# Patient Record
Sex: Female | Born: 1990 | Race: Black or African American | Hispanic: No | Marital: Single | Smoking: Former smoker
Health system: Southern US, Community
[De-identification: ages and names within clinical notes are randomized; demographics above are authoritative.]

## PROBLEM LIST (undated history)

## (undated) DIAGNOSIS — R55 Syncope and collapse: Secondary | ICD-10-CM

## (undated) DIAGNOSIS — F32A Depression, unspecified: Secondary | ICD-10-CM

## (undated) DIAGNOSIS — F329 Major depressive disorder, single episode, unspecified: Secondary | ICD-10-CM

## (undated) DIAGNOSIS — O1490 Unspecified pre-eclampsia, unspecified trimester: Secondary | ICD-10-CM

## (undated) DIAGNOSIS — F419 Anxiety disorder, unspecified: Secondary | ICD-10-CM

## (undated) DIAGNOSIS — J45909 Unspecified asthma, uncomplicated: Secondary | ICD-10-CM

## (undated) HISTORY — PX: ECTOPIC PREGNANCY SURGERY: SHX613

## (undated) HISTORY — DX: Unspecified pre-eclampsia, unspecified trimester: O14.90

---
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
## 2010-11-22 ENCOUNTER — Emergency Department: Payer: Self-pay | Admitting: Emergency Medicine

## 2011-03-01 LAB — HM PAP SMEAR: HM Pap smear: NEGATIVE

## 2011-03-27 ENCOUNTER — Emergency Department: Payer: Self-pay | Admitting: *Deleted

## 2011-08-29 ENCOUNTER — Emergency Department: Payer: Self-pay | Admitting: Emergency Medicine

## 2011-12-31 ENCOUNTER — Emergency Department: Payer: Self-pay | Admitting: Emergency Medicine

## 2011-12-31 LAB — CBC WITH DIFFERENTIAL/PLATELET
Basophil #: 0 10*3/uL (ref 0.0–0.1)
Eosinophil #: 0.1 10*3/uL (ref 0.0–0.7)
Eosinophil %: 0.8 %
HCT: 43.5 % (ref 35.0–47.0)
HGB: 14.2 g/dL (ref 12.0–16.0)
Lymphocyte #: 0.7 10*3/uL — ABNORMAL LOW (ref 1.0–3.6)
Lymphocyte %: 5.6 %
MCHC: 32.7 g/dL (ref 32.0–36.0)
MCV: 91 fL (ref 80–100)
Neutrophil %: 87.9 %
RDW: 13.5 % (ref 11.5–14.5)
WBC: 12.1 10*3/uL — ABNORMAL HIGH (ref 3.6–11.0)

## 2011-12-31 LAB — BASIC METABOLIC PANEL
Anion Gap: 10 (ref 7–16)
Calcium, Total: 8.9 mg/dL (ref 8.5–10.1)
Creatinine: 1.03 mg/dL (ref 0.60–1.30)
EGFR (African American): >60
EGFR (Non-African Amer.): >60
Osmolality: 277 (ref 275–301)

## 2012-05-14 ENCOUNTER — Emergency Department: Payer: Self-pay

## 2012-05-14 LAB — COMPREHENSIVE METABOLIC PANEL
Albumin: 4.1 g/dL (ref 3.4–5.0)
Anion Gap: 8 (ref 7–16)
BUN: 14 mg/dL (ref 7–18)
Bilirubin,Total: 0.4 mg/dL (ref 0.2–1.0)
Co2: 26 mmol/L (ref 21–32)
Creatinine: 1.22 mg/dL (ref 0.60–1.30)
EGFR (Non-African Amer.): >60
Glucose: 72 mg/dL (ref 65–99)
Osmolality: 284 (ref 275–301)
Potassium: 3.7 mmol/L (ref 3.5–5.1)
SGOT(AST): 24 U/L (ref 15–37)
Sodium: 143 mmol/L (ref 136–145)
Total Protein: 7.8 g/dL (ref 6.4–8.2)

## 2012-05-14 LAB — PREGNANCY, URINE: Pregnancy Test, Urine: NEGATIVE m[IU]/mL

## 2012-05-14 LAB — URINALYSIS, COMPLETE
Bilirubin,UR: NEGATIVE
Glucose,UR: NEGATIVE mg/dL (ref 0–75)
Leukocyte Esterase: NEGATIVE
Ph: 7 (ref 4.5–8.0)
RBC,UR: <1 /HPF (ref 0–5)
Specific Gravity: 1.014 (ref 1.003–1.030)
Squamous Epithelial: 1

## 2012-05-14 LAB — CBC
HCT: 39.8 % (ref 35.0–47.0)
HGB: 13.6 g/dL (ref 12.0–16.0)
MCH: 30.9 pg (ref 26.0–34.0)
MCHC: 34.1 g/dL (ref 32.0–36.0)

## 2012-05-14 LAB — LIPASE, BLOOD: Lipase: 238 U/L (ref 73–393)

## 2012-08-23 ENCOUNTER — Emergency Department: Payer: Self-pay | Admitting: Emergency Medicine

## 2012-08-23 LAB — BASIC METABOLIC PANEL
Anion Gap: 9 (ref 7–16)
BUN: 11 mg/dL (ref 7–18)
Calcium, Total: 9 mg/dL (ref 8.5–10.1)
Creatinine: 1.13 mg/dL (ref 0.60–1.30)
EGFR (African American): >60
Glucose: 74 mg/dL (ref 65–99)
Osmolality: 274 (ref 275–301)
Potassium: 3.6 mmol/L (ref 3.5–5.1)
Sodium: 138 mmol/L (ref 136–145)

## 2012-08-23 LAB — CBC WITH DIFFERENTIAL/PLATELET
Basophil %: 0.6 %
Eosinophil #: 0 10*3/uL (ref 0.0–0.7)
Eosinophil %: 0.4 %
HCT: 42.7 % (ref 35.0–47.0)
MCHC: 32.6 g/dL (ref 32.0–36.0)
MCV: 90 fL (ref 80–100)
Monocyte #: 0.8 x10 3/mm (ref 0.2–0.9)
Monocyte %: 13.6 %
Neutrophil %: 77.7 %

## 2012-08-23 LAB — RAPID INFLUENZA A&B ANTIGENS

## 2012-12-06 ENCOUNTER — Emergency Department: Payer: Self-pay | Admitting: Emergency Medicine

## 2012-12-07 LAB — BASIC METABOLIC PANEL
Anion Gap: 6 — ABNORMAL LOW (ref 7–16)
BUN: 18 mg/dL (ref 7–18)
Chloride: 109 mmol/L — ABNORMAL HIGH (ref 98–107)
Co2: 25 mmol/L (ref 21–32)
EGFR (Non-African Amer.): 53 — ABNORMAL LOW
Osmolality: 282 (ref 275–301)
Potassium: 3.8 mmol/L (ref 3.5–5.1)
Sodium: 140 mmol/L (ref 136–145)

## 2012-12-07 LAB — CBC
HCT: 40.6 % (ref 35.0–47.0)
HGB: 13.7 g/dL (ref 12.0–16.0)
Platelet: 228 10*3/uL (ref 150–440)
RBC: 4.48 10*6/uL (ref 3.80–5.20)
RDW: 12.6 % (ref 11.5–14.5)

## 2013-01-08 ENCOUNTER — Encounter: Payer: Self-pay | Admitting: Nurse Practitioner

## 2013-01-08 ENCOUNTER — Encounter: Payer: Self-pay | Admitting: Cardiothoracic Surgery

## 2013-01-11 ENCOUNTER — Encounter: Payer: Self-pay | Admitting: Cardiothoracic Surgery

## 2013-01-11 ENCOUNTER — Encounter: Payer: Self-pay | Admitting: Nurse Practitioner

## 2013-02-10 ENCOUNTER — Encounter: Payer: Self-pay | Admitting: Cardiothoracic Surgery

## 2013-02-10 ENCOUNTER — Encounter: Payer: Self-pay | Admitting: Nurse Practitioner

## 2013-02-10 LAB — WOUND CULTURE

## 2013-07-14 ENCOUNTER — Encounter (HOSPITAL_COMMUNITY): Payer: Self-pay | Admitting: Emergency Medicine

## 2013-07-14 ENCOUNTER — Emergency Department (HOSPITAL_COMMUNITY)
Admission: EM | Admit: 2013-07-14 | Discharge: 2013-07-14 | Disposition: A | Discharge status: Home / Self Care | Payer: Medicaid Other | Attending: Emergency Medicine | Admitting: Emergency Medicine

## 2013-07-14 ENCOUNTER — Emergency Department (HOSPITAL_COMMUNITY): Payer: Medicaid Other

## 2013-07-14 DIAGNOSIS — F329 Major depressive disorder, single episode, unspecified: Secondary | ICD-10-CM | POA: Insufficient documentation

## 2013-07-14 DIAGNOSIS — Z3202 Encounter for pregnancy test, result negative: Secondary | ICD-10-CM | POA: Insufficient documentation

## 2013-07-14 DIAGNOSIS — R112 Nausea with vomiting, unspecified: Secondary | ICD-10-CM | POA: Insufficient documentation

## 2013-07-14 DIAGNOSIS — Z79899 Other long term (current) drug therapy: Secondary | ICD-10-CM | POA: Insufficient documentation

## 2013-07-14 DIAGNOSIS — F3289 Other specified depressive episodes: Secondary | ICD-10-CM | POA: Insufficient documentation

## 2013-07-14 DIAGNOSIS — Z9889 Other specified postprocedural states: Secondary | ICD-10-CM | POA: Insufficient documentation

## 2013-07-14 DIAGNOSIS — R1011 Right upper quadrant pain: Secondary | ICD-10-CM | POA: Insufficient documentation

## 2013-07-14 DIAGNOSIS — F411 Generalized anxiety disorder: Secondary | ICD-10-CM | POA: Insufficient documentation

## 2013-07-14 HISTORY — DX: Depression, unspecified: F32.A

## 2013-07-14 HISTORY — DX: Major depressive disorder, single episode, unspecified: F32.9

## 2013-07-14 HISTORY — DX: Anxiety disorder, unspecified: F41.9

## 2013-07-14 LAB — CBC WITH DIFFERENTIAL/PLATELET
Eosinophils Absolute: 0.1 10*3/uL (ref 0.0–0.7)
Hemoglobin: 12.6 g/dL (ref 12.0–15.0)
Lymphocytes Relative: 25 % (ref 12–46)
Lymphs Abs: 1.5 10*3/uL (ref 0.7–4.0)
MCH: 30.7 pg (ref 26.0–34.0)
Monocytes Relative: 9 % (ref 3–12)
Neutro Abs: 3.8 10*3/uL (ref 1.7–7.7)
Neutrophils Relative %: 65 % (ref 43–77)
RBC: 4.11 MIL/uL (ref 3.87–5.11)
WBC: 5.8 10*3/uL (ref 4.0–10.5)

## 2013-07-14 LAB — URINALYSIS, ROUTINE W REFLEX MICROSCOPIC
Bilirubin Urine: NEGATIVE
Hgb urine dipstick: NEGATIVE
Ketones, ur: NEGATIVE mg/dL
Leukocytes, UA: NEGATIVE
Nitrite: NEGATIVE
Specific Gravity, Urine: 1.011 (ref 1.005–1.030)
Urobilinogen, UA: 0.2 mg/dL (ref 0.0–1.0)

## 2013-07-14 LAB — COMPREHENSIVE METABOLIC PANEL
Alkaline Phosphatase: 51 U/L (ref 39–117)
BUN: 9 mg/dL (ref 6–23)
CO2: 28 mEq/L (ref 19–32)
Chloride: 105 mEq/L (ref 96–112)
GFR calc Af Amer: >90 mL/min (ref >90)
GFR calc non Af Amer: 80 mL/min — ABNORMAL LOW (ref >90)
Glucose, Bld: 92 mg/dL (ref 70–99)
Potassium: 3.5 mEq/L (ref 3.5–5.1)
Total Bilirubin: 0.5 mg/dL (ref 0.3–1.2)
Total Protein: 6.6 g/dL (ref 6.0–8.3)

## 2013-07-14 LAB — LIPASE, BLOOD: Lipase: 20 U/L (ref 11–59)

## 2013-07-14 MED ORDER — MORPHINE SULFATE 4 MG/ML IJ SOLN
4.0000 mg | Freq: Once | INTRAMUSCULAR | Status: AC
  Administered 2013-07-14: 4 mg via INTRAVENOUS
  Filled 2013-07-14: qty 1

## 2013-07-14 MED ORDER — ONDANSETRON HCL 4 MG/2ML IJ SOLN
4.0000 mg | Freq: Once | INTRAMUSCULAR | Status: AC
  Administered 2013-07-14: 4 mg via INTRAVENOUS
  Filled 2013-07-14: qty 2

## 2013-07-14 MED ORDER — HYDROCODONE-ACETAMINOPHEN 5-325 MG PO TABS: 2.0000 | ORAL_TABLET | ORAL | Status: AC | PRN

## 2013-07-14 MED ORDER — ONDANSETRON 4 MG PO TBDP: 4.0000 mg | ORAL_TABLET | Freq: Three times a day (TID) | ORAL | Status: AC | PRN

## 2013-07-14 NOTE — ED Notes (Signed)
Pt reports right side abd pain that started yesterday, having nausea, no vomiting or diarrhea. Hx of same and was told she possibly has gallstones and have US done but pt has not followed up. Also reports recent yeast infection.

## 2013-07-14 NOTE — ED Provider Notes (Signed)
CSN: [id number]     Arrival date & time 07/14/13  1112 History   First MD Initiated Contact with Patient 07/14/13 1121     Chief Complaint  Patient presents with  . Abdominal Pain    HPI  Olivia Hanson is a 22 y.o. female with a PMH of anxiety and depression who presents to the ED for evaluation of abdominal pain.  History was provided by the patient.  Patient states that she's had abdominal pain starting this morning. Her pain is located in the right upper quadrant with some radiation to her right flank. Her pain is described as a sharp pain with intermittent episodes of fluctuation. She did not take anything for pain prior to arrival. She has had similar abdominal pain 2 years ago which was told she may have gallstones. She was told she needs to followup regarding this however she never didn't because the pain resolved. Movement makes her pain worse and nothing improves her pain. She states she's very nauseated and had a few episodes of retching however had no emesis. She denies any bowel symptoms including diarrhea or constipation. She denies any urinary symptoms including dysuria, hematuria, vaginal discharge, vaginal bleeding, genital sores or itching. She states that she just finished her menstrual period and takes Monistat at the end of her period. She denies any symptoms of a yeast infection, it is just "something she does."  Previous abdominal surgeries include a C-section.  She denies any fevers, chills, change in appetite or activity, and otherwise has been well.  No chest pain, SOB, headache, dizziness, lightheadedness, headaches, weakness, or leg edema.   No recent travel.     Past Medical History  Diagnosis Date  . Depression   . Anxiety    History reviewed. No pertinent past surgical history. History reviewed. No pertinent family history. History  Substance Use Topics  . Smoking status: Not on file  . Smokeless tobacco: Not on file  . Alcohol Use: No   OB History   Grav Para  Term Preterm Abortions TAB SAB Ect Mult Living                 Review of Systems  Constitutional: Negative for fever, chills, diaphoresis, activity change, appetite change and fatigue.  HENT: Negative for congestion, rhinorrhea and sore throat.   Eyes: Negative for visual disturbance.  Respiratory: Negative for cough, shortness of breath and wheezing.   Cardiovascular: Negative for chest pain and leg swelling.  Gastrointestinal: Positive for nausea, vomiting and abdominal pain. Negative for diarrhea, constipation, anal bleeding and rectal pain.  Genitourinary: Negative for dysuria, urgency, hematuria, decreased urine volume, vaginal bleeding, vaginal discharge and vaginal pain.  Musculoskeletal: Negative for back pain, myalgias and neck pain.  Skin: Negative for color change and wound.  Neurological: Negative for dizziness, syncope, weakness, light-headedness, numbness and headaches.  Psychiatric/Behavioral: Negative for confusion.    Allergies  Review of patient's allergies indicates no known allergies.  Home Medications   Current Outpatient Rx  Name  Route  Sig  Dispense  Refill  . citalopram (CELEXA) 20 MG tablet   Oral   Take 20 mg by mouth daily.         . clonazePAM (KLONOPIN) 0.5 MG tablet   Oral   Take 0.5 mg by mouth 2 (two) times daily.         . miconazole (MONISTAT 7) 2 % vaginal cream   Vaginal   Place 1 Applicatorful vaginally daily.  BP 140/70  Pulse 60  Temp(Src) 97.8 F (36.6 C) (Oral)  Resp 22  Wt 134 lb 6.4 oz (60.963 kg)  SpO2 100%  LMP 07/08/2013  Filed Vitals:   07/14/13 1432 07/14/13 1500 07/14/13 1535 07/14/13 1545  BP: 107/61 104/70 118/63 105/66  Pulse: 68 49 42   Temp:      TempSrc:      Resp:   16   Weight:      SpO2: 98% 100% 100% 100%     Physical Exam  Nursing note and vitals reviewed. Constitutional: She is oriented to person, place, and time. She appears well-developed and well-nourished. No distress.  HENT:   Head: Normocephalic and atraumatic.  Right Ear: External ear normal.  Left Ear: External ear normal.  Nose: Nose normal.  Mouth/Throat: Oropharynx is clear and moist. No oropharyngeal exudate.  Eyes: Conjunctivae are normal. Pupils are equal, round, and reactive to light. Right eye exhibits no discharge. Left eye exhibits no discharge.  Neck: Neck supple.  Cardiovascular: Normal rate, regular rhythm, normal heart sounds and intact distal pulses.  Exam reveals no gallop and no friction rub.   No murmur heard. Pulmonary/Chest: Effort normal and breath sounds normal. No respiratory distress. She has no wheezes. She has no rales. She exhibits no tenderness.  Abdominal: Soft. Bowel sounds are normal. She exhibits no distension and no mass. There is tenderness. There is no rebound and no guarding.  RUQ tenderness to palpation  Musculoskeletal: Normal range of motion. She exhibits no edema and no tenderness.  No flank/CVA/lumbar tenderness throughout  Neurological: She is alert and oriented to person, place, and time.  Skin: Skin is warm and dry. She is not diaphoretic.    ED Course  Procedures (including critical care time) Labs Review Labs Reviewed  CBC WITH DIFFERENTIAL  COMPREHENSIVE METABOLIC PANEL  LIPASE, BLOOD  URINALYSIS, ROUTINE W REFLEX MICROSCOPIC   Imaging Review No results found.  EKG Interpretation   None      Results for orders placed during the hospital encounter of 07/14/13  CBC WITH DIFFERENTIAL      Result Value Range   WBC 5.8  4.0 - 10.5 K/uL   RBC 4.11  3.87 - 5.11 MIL/uL   Hemoglobin 12.6  12.0 - 15.0 g/dL   HCT 16.1  09.6 - 04.5 %   MCV 87.6  78.0 - 100.0 fL   MCH 30.7  26.0 - 34.0 pg   MCHC 35.0  30.0 - 36.0 g/dL   RDW 40.9  81.1 - 91.4 %   Platelets 219  150 - 400 K/uL   Neutrophils Relative % 65  43 - 77 %   Neutro Abs 3.8  1.7 - 7.7 K/uL   Lymphocytes Relative 25  12 - 46 %   Lymphs Abs 1.5  0.7 - 4.0 K/uL   Monocytes Relative 9  3 - 12 %    Monocytes Absolute 0.5  0.1 - 1.0 K/uL   Eosinophils Relative 1  0 - 5 %   Eosinophils Absolute 0.1  0.0 - 0.7 K/uL   Basophils Relative 0  0 - 1 %   Basophils Absolute 0.0  0.0 - 0.1 K/uL  COMPREHENSIVE METABOLIC PANEL      Result Value Range   Sodium 142  135 - 145 mEq/L   Potassium 3.5  3.5 - 5.1 mEq/L   Chloride 105  96 - 112 mEq/L   CO2 28  19 - 32 mEq/L   Glucose, Bld 92  70 - 99 mg/dL   BUN 9  6 - 23 mg/dL   Creatinine, Ser 7.82  0.50 - 1.10 mg/dL   Calcium 8.9  8.4 - 95.6 mg/dL   Total Protein 6.6  6.0 - 8.3 g/dL   Albumin 3.9  3.5 - 5.2 g/dL   AST 15  0 - 37 U/L   ALT 13  0 - 35 U/L   Alkaline Phosphatase 51  39 - 117 U/L   Total Bilirubin 0.5  0.3 - 1.2 mg/dL   GFR calc non Af Amer 80 (*) >90 mL/min   GFR calc Af Amer >90  >90 mL/min  LIPASE, BLOOD      Result Value Range   Lipase 20  11 - 59 U/L  URINALYSIS, ROUTINE W REFLEX MICROSCOPIC      Result Value Range   Color, Urine YELLOW  YELLOW   APPearance CLEAR  CLEAR   Specific Gravity, Urine 1.011  1.005 - 1.030   pH 6.5  5.0 - 8.0   Glucose, UA NEGATIVE  NEGATIVE mg/dL   Hgb urine dipstick NEGATIVE  NEGATIVE   Bilirubin Urine NEGATIVE  NEGATIVE   Ketones, ur NEGATIVE  NEGATIVE mg/dL   Protein, ur NEGATIVE  NEGATIVE mg/dL   Urobilinogen, UA 0.2  0.0 - 1.0 mg/dL   Nitrite NEGATIVE  NEGATIVE   Leukocytes, UA NEGATIVE  NEGATIVE  POCT PREGNANCY, URINE      Result Value Range   Preg Test, Ur NEGATIVE  NEGATIVE        US Abdomen Complete (Final result)  Result time: 07/14/13 14:41:01    Final result by Rad Results In Interface (07/14/13 14:41:01)    Narrative:   CLINICAL DATA: Abdominal pain.  EXAM: ULTRASOUND ABDOMEN COMPLETE  COMPARISON: None.  FINDINGS: Gallbladder:  The gallbladder is adequately distended with no evidence of stones, wall thickening, or pericholecystic fluid. There is no positive sonographic Murphy's sign.  Common bile duct:  Diameter: 1.8 mm which is normal.  Liver:  No  focal lesion identified. Within normal limits in parenchymal echogenicity.  IVC:  No abnormality visualized.  Pancreas:  Visualized portion unremarkable.  Spleen:  Size and appearance within normal limits.  Right Kidney:  Length: 9.8 cm. Echogenicity within normal limits. No mass or hydronephrosis visualized.  Left Kidney:  Length: 10.3 cm. Echogenicity within normal limits. No mass or hydronephrosis visualized.  Abdominal aorta:  No aneurysm visualized.  Other findings:  None.  IMPRESSION: Normal abdominal ultrasound examination.   Electronically Signed By: David [place]azil[place] On: 07/14/2013 14:41         MDM   Olivia Hanson is a 22 y.o. female with a PMH of anxiety and depression who presents to the ED for evaluation of abdominal pain.  Morphine ordered for pain.  Zofran ordered for nausea.  CBC, CMP, lipase, UA, US abdomen ordered.    Rechecks  2:35 PM = Patient states she feels much better.  No abdominal pain.  Nausea improved and is returning.   3:30 PM = Repeat abdominal exam reveals mild RUQ tenderness however this has improved.  Spoke with patient about further testing and she does not want a CT.  She states she would like to go home and see if her pain returns/worsens.     Patient evaluated in emergency department for abdominal pain. Etiology of her right upper quadrant abdominal pain is unclear. Abdominal ultrasound was negative for an acute intra-abdominal process. Her labs were unremarkable. Patient had no complaints of lower abdominal  pain or pelvic pain. She had no GU symptoms as well. I did not feel a pelvic exam was necessary at this time. Patient had significant improvements in her pain with morphine. She had no episodes of emesis throughout her ED visit.  Patient felt well enough to be discharged home. She was given return precautions.  Appendicitis score is 1 for nausea.  She specifically was told to return to the emergency department if her  abdominal pain worsens or changes. Patient was given resources to followup with a primary care provider. She was given a prescription for Vicodin and Zofran for outpatient management. She was in agreement with discharge and plan.     Discharge Medication List as of 07/14/2013  3:37 PM    START taking these medications   Details  HYDROcodone-acetaminophen (NORCO/VICODIN) 5-325 MG per tablet Take 2 tablets by mouth every 4 (four) hours as needed., Starting 07/14/2013, Until Discontinued, Print    ondansetron (ZOFRAN ODT) 4 MG disintegrating tablet Take 1 tablet (4 mg total) by mouth every 8 (eight) hours as needed for nausea., Starting 07/14/2013, Until Discontinued, Print         Final impressions: 1. RUQ abdominal pain      Luiz Iron PA-C   This patient was discussed with Dr. Arita Miss, PA-C 07/14/13 1904

## 2013-07-15 NOTE — ED Provider Notes (Signed)
Medical screening examination/treatment/procedure(s) were conducted as a shared visit with non-physician practitioner(s) and myself.  I personally evaluated the patient during the encounter. Patient is a 22 year old female presents with complaints of right upper quadrant abdominal pain that started suddenly 2 hours prior to arrival. She denies any injury or trauma. She denies any vomiting or diarrhea. She denies experiencing prior episodes such as this.  On exam vitals are stable and she is afebrile. Heart is regular rate and rhythm and lungs are clear. There is tenderness to palpation in the right upper quadrant with no rebound and no guarding. There is no right lower quadrant tenderness to palpation and no CVA tenderness.  Workup reveals normal labs including LFTs and white count. Ultrasound of the right upper quadrant shows no gallstones and is otherwise unremarkable. At this point I feel as though she is stable for discharge. We did discuss the possibility of a CT scan, however the patient did not want to do this. She preferred to take the watch and wait approach and will return in the next 24 hours if not improving or if her symptoms worsen.       Geoffery Lyons, MD 07/15/13 [phone]

## 2013-08-10 ENCOUNTER — Emergency Department: Payer: Self-pay | Admitting: Emergency Medicine

## 2013-08-10 LAB — URINALYSIS, COMPLETE
Bacteria: NONE SEEN
Glucose,UR: NEGATIVE mg/dL (ref 0–75)
Nitrite: NEGATIVE
Ph: 6 (ref 4.5–8.0)
RBC,UR: 1 /HPF (ref 0–5)
Specific Gravity: 1.014 (ref 1.003–1.030)
Squamous Epithelial: 1
WBC UR: 1 /HPF (ref 0–5)

## 2013-08-10 LAB — CBC WITH DIFFERENTIAL/PLATELET
Basophil %: 0.9 %
Eosinophil #: 0 10*3/uL (ref 0.0–0.7)
HCT: 42.3 % (ref 35.0–47.0)
HGB: 14.2 g/dL (ref 12.0–16.0)
Lymphocyte #: 1.6 10*3/uL (ref 1.0–3.6)
Lymphocyte %: 26.3 %
MCHC: 33.5 g/dL (ref 32.0–36.0)
MCV: 89 fL (ref 80–100)
Monocyte #: 0.6 x10 3/mm (ref 0.2–0.9)
Monocyte %: 8.9 %
Platelet: 213 10*3/uL (ref 150–440)
RBC: 4.79 10*6/uL (ref 3.80–5.20)
RDW: 12.7 % (ref 11.5–14.5)

## 2013-08-10 LAB — HCG, QUANTITATIVE, PREGNANCY: Beta Hcg, Quant.: 688 m[IU]/mL — ABNORMAL HIGH

## 2013-08-26 LAB — BASIC METABOLIC PANEL WITH GFR
Anion Gap: 8
BUN: 11 mg/dL
Calcium, Total: 9.1 mg/dL
Chloride: 104 mmol/L
Co2: 25 mmol/L
Creatinine: 0.97 mg/dL
EGFR (African American): >60
EGFR (Non-African Amer.): >60
Glucose: 89 mg/dL
Osmolality: 273
Potassium: 3.2 mmol/L — ABNORMAL LOW
Sodium: 137 mmol/L

## 2013-08-26 LAB — CBC
HCT: 40.1 % (ref 35.0–47.0)
HGB: 13.7 g/dL (ref 12.0–16.0)
MCH: 30.3 pg (ref 26.0–34.0)
MCHC: 34.2 g/dL (ref 32.0–36.0)
MCV: 89 fL (ref 80–100)
Platelet: 285 10*3/uL (ref 150–440)
RBC: 4.52 10*6/uL (ref 3.80–5.20)
RDW: 13.1 % (ref 11.5–14.5)
WBC: 9.3 10*3/uL (ref 3.6–11.0)

## 2013-08-27 ENCOUNTER — Ambulatory Visit: Payer: Self-pay | Admitting: Obstetrics & Gynecology

## 2013-08-27 LAB — HCG, QUANTITATIVE, PREGNANCY: BETA HCG, QUANT.: 15022 m[IU]/mL — AB

## 2013-08-28 LAB — PATHOLOGY REPORT

## 2014-02-10 ENCOUNTER — Emergency Department: Payer: Self-pay | Admitting: Emergency Medicine

## 2014-03-09 LAB — HM HIV SCREENING LAB: HM HIV Screening: NEGATIVE

## 2014-03-12 ENCOUNTER — Emergency Department: Payer: Self-pay | Admitting: Emergency Medicine

## 2014-04-20 ENCOUNTER — Emergency Department: Payer: Self-pay | Admitting: Emergency Medicine

## 2014-04-20 LAB — COMPREHENSIVE METABOLIC PANEL
ALBUMIN: 4 g/dL (ref 3.4–5.0)
AST: 16 U/L (ref 15–37)
Alkaline Phosphatase: 63 U/L
Anion Gap: 4 — ABNORMAL LOW (ref 7–16)
BUN: 13 mg/dL (ref 7–18)
Bilirubin,Total: 0.4 mg/dL (ref 0.2–1.0)
CALCIUM: 9 mg/dL (ref 8.5–10.1)
CHLORIDE: 107 mmol/L (ref 98–107)
Co2: 27 mmol/L (ref 21–32)
Creatinine: 1.08 mg/dL (ref 0.60–1.30)
EGFR (African American): >60
EGFR (Non-African Amer.): >60
Glucose: 91 mg/dL (ref 65–99)
Osmolality: 275 (ref 275–301)
Potassium: 3.9 mmol/L (ref 3.5–5.1)
SGPT (ALT): 22 U/L
Sodium: 138 mmol/L (ref 136–145)
Total Protein: 7.6 g/dL (ref 6.4–8.2)

## 2014-04-20 LAB — CBC WITH DIFFERENTIAL/PLATELET
Basophil #: 0 10*3/uL (ref 0.0–0.1)
Basophil %: 0.5 %
EOS ABS: 0.2 10*3/uL (ref 0.0–0.7)
EOS PCT: 2.3 %
HCT: 41.5 % (ref 35.0–47.0)
HGB: 13.7 g/dL (ref 12.0–16.0)
LYMPHS PCT: 25.4 %
Lymphocyte #: 2 10*3/uL (ref 1.0–3.6)
MCH: 30.2 pg (ref 26.0–34.0)
MCHC: 33.1 g/dL (ref 32.0–36.0)
MCV: 91 fL (ref 80–100)
MONOS PCT: 6.5 %
Monocyte #: 0.5 x10 3/mm (ref 0.2–0.9)
NEUTROS ABS: 5.3 10*3/uL (ref 1.4–6.5)
NEUTROS PCT: 65.3 %
Platelet: 241 10*3/uL (ref 150–440)
RBC: 4.55 10*6/uL (ref 3.80–5.20)
RDW: 13.5 % (ref 11.5–14.5)
WBC: 8 10*3/uL (ref 3.6–11.0)

## 2014-04-20 LAB — URINALYSIS, COMPLETE
BACTERIA: NONE SEEN
Bilirubin,UR: NEGATIVE
Blood: NEGATIVE
Glucose,UR: NEGATIVE mg/dL (ref 0–75)
Ketone: NEGATIVE
Leukocyte Esterase: NEGATIVE
NITRITE: NEGATIVE
Ph: 6 (ref 4.5–8.0)
Protein: NEGATIVE
RBC, UR: NONE SEEN /HPF (ref 0–5)
SPECIFIC GRAVITY: 1.011 (ref 1.003–1.030)
Squamous Epithelial: 1
WBC UR: NONE SEEN /HPF (ref 0–5)

## 2014-04-20 LAB — LIPASE, BLOOD: Lipase: 217 U/L (ref 73–393)

## 2014-09-24 ENCOUNTER — Emergency Department: Payer: Self-pay | Admitting: Emergency Medicine

## 2014-12-04 NOTE — Op Note (Signed)
PATIENT NAME:  Olivia Hanson, Olivia Hanson MR#:  [id number] DATE OF BIRTH:  [birth year]  DATE OF PROCEDURE:  08/27/2013  PREOPERATIVE DIAGNOSIS: Right ectopic pregnancy.   POSTOPERATIVE DIAGNOSIS: Ruptured right ectopic pregnancy.   SURGEON: Dierdre Searles. Olivia Hanson, M.D.   ANESTHESIA: General.   ESTIMATED BLOOD LOSS: There was 250 mL of blood in the pelvis at the start of the case. There was minimal blood loss with any additional procedures during the case.   COMPLICATIONS: None.   FINDINGS: Significant adhesions in the pelvis. There was a swollen right fallopian tube with rupture of its ectopic pregnancy that was bleeding.   DISPOSITION: To recovery room in stable condition.   TECHNIQUE: The patient is prepped and draped in the usual sterile fashion. After adequate anesthesia is obtained in the dorsal lithotomy position, the bladder is drained with a Robinson catheter. Speculum was placed, and the cervix was identified and a Hulka tenaculum was placed.   A Veress needle is placed through a 5 mm infraumbilical incision after Marcaine is used to anesthetize the skin. Veress needle placement is confirmed using the hanging drop technique, and the abdomen is then insufflated with CO2 gas. A 5 mm trocar is then inserted under direct visualization with a laparoscope with no injuries or bleeding noted. The patient is placed in Trendelenburg position, and the above-mentioned adhesions and blood in the pelvis are noted. A left lower quadrant 5 mm incision is made with a trocar placed lateral to the inferior epigastric blood vessels with no injuries or bleeding noted. Using the 5 mm Harmonic scalpel, adhesions of the omentum to the anterior abdominal wall in front of the umbilical trocar are then lysed carefully with no bleeding noted for better visualization purposes. An 11 mm trocar is then placed in the suprapubic region for further instrumentation. Aspiration of blood is performed. The right fallopian tube is identified  and grasped and is carefully dissected free with preservation of the right ovary and its blood supply. Once the fallopian tube is dissected free, excellent hemostasis is noted at this site. It is placed in an Endopouch and removed and sent to pathology for further review. The pelvic area was irrigated with fluid, with aspiration of all fluid. Excellent hemostasis is noted of the operative site. Some adhesions are noted still where the cesarean section was. This was posterior to the uterus and around the left adnexa, although not significantly encasing the left adnexa. These are left in place at this time as there is no additional benefit to removing these adhesions. The bowel is normal. Appendix is normal. Liver is normal. Fluid is aspirated. The patient is leveled. Trocars are removed after gas is expelled from the abdomen. The skin is closed with Dermabond. Hulka tenaculum was also removed. The patient goes to recovery room in stable condition. All sponge, instrument and needle counts are correct.   ____________________________ R. Annamarie MajorPaul Dmani Mizer, MD rph:gb D: 08/27/2013 03:11:09 ET T: 08/27/2013 03:34:24 ET JOB#: [id number]  cc: Dierdre Searles. Olivia Taelyn Broecker, MD, <Dictator> Olivia MustardOBERT P [name] MD ELECTRONICALLY SIGNED 08/27/2013 8:59

## 2015-01-19 ENCOUNTER — Emergency Department
Admission: EM | Admit: 2015-01-19 | Discharge: 2015-01-19 | Disposition: A | Discharge status: Home / Self Care | Payer: Medicaid Other | Attending: Emergency Medicine | Admitting: Emergency Medicine

## 2015-01-19 ENCOUNTER — Other Ambulatory Visit: Payer: Self-pay

## 2015-01-19 ENCOUNTER — Encounter: Payer: Self-pay | Admitting: *Deleted

## 2015-01-19 DIAGNOSIS — R251 Tremor, unspecified: Secondary | ICD-10-CM | POA: Insufficient documentation

## 2015-01-19 DIAGNOSIS — Z79899 Other long term (current) drug therapy: Secondary | ICD-10-CM | POA: Insufficient documentation

## 2015-01-19 DIAGNOSIS — Z3202 Encounter for pregnancy test, result negative: Secondary | ICD-10-CM | POA: Insufficient documentation

## 2015-01-19 DIAGNOSIS — R55 Syncope and collapse: Secondary | ICD-10-CM

## 2015-01-19 HISTORY — DX: Unspecified asthma, uncomplicated: J45.909

## 2015-01-19 LAB — BASIC METABOLIC PANEL
Anion gap: 7 (ref 5–15)
BUN: 15 mg/dL (ref 6–20)
CHLORIDE: 103 mmol/L (ref 101–111)
CO2: 27 mmol/L (ref 22–32)
Calcium: 9.2 mg/dL (ref 8.9–10.3)
Creatinine, Ser: 1.17 mg/dL — ABNORMAL HIGH (ref 0.44–1.00)
GFR calc non Af Amer: >60 mL/min (ref >60)
Glucose, Bld: 66 mg/dL (ref 65–99)
Potassium: 3.7 mmol/L (ref 3.5–5.1)
Sodium: 137 mmol/L (ref 135–145)

## 2015-01-19 LAB — CBC
HEMATOCRIT: 42.4 % (ref 35.0–47.0)
HEMOGLOBIN: 14.1 g/dL (ref 12.0–16.0)
MCH: 30.6 pg (ref 26.0–34.0)
MCHC: 33.2 g/dL (ref 32.0–36.0)
MCV: 92 fL (ref 80.0–100.0)
Platelets: 237 10*3/uL (ref 150–440)
RBC: 4.61 MIL/uL (ref 3.80–5.20)
RDW: 13.4 % (ref 11.5–14.5)
WBC: 9.5 10*3/uL (ref 3.6–11.0)

## 2015-01-19 LAB — PREGNANCY, URINE: Preg Test, Ur: NEGATIVE

## 2015-01-19 MED ORDER — MORPHINE SULFATE 4 MG/ML IJ SOLN
INTRAMUSCULAR | Status: AC
  Filled 2015-01-19: qty 1

## 2015-01-19 MED ORDER — SODIUM CHLORIDE 0.9 % IV BOLUS (SEPSIS)
1000.0000 mL | Freq: Once | INTRAVENOUS | Status: AC
Start: 2015-01-19 — End: 2015-01-19
  Administered 2015-01-19: 1000 mL via INTRAVENOUS

## 2015-01-19 NOTE — ED Notes (Signed)
Pt reports that she became dizzy and [facility] Bystander reported that she was shaking all over. Pt was then leaving [employer] and passed out again while with her boyfriend

## 2015-01-19 NOTE — ED Provider Notes (Signed)
[facility] Emergency Department Provider Note  ____________________________________________  Time seen: Approximately 7:32 PM  I have reviewed the triage vital signs and the nursing notes.   HISTORY  Chief Complaint Loss of Consciousness    HPI Olivia Hanson is a 24 y.o. female who passed out while at [facility]. Patient reports that it's been warm for last 2 weeks, she doesn't use well it's hot outside. Today while standing in the [employer] she started feeling lightheaded, dizzy, and reached for the counter but then passed out. She will quickly on the floor. Witnesses did say she was shaking for a few seconds, but then woke up. She denies any head injury or pain. The patient then got up and attempted to walk out of the store, but passed out again. This time she again woke up on the floor. There was no seizure witnessed. She did not bite her tongue. She did not wet herself.  She denies any pain or concerns in the ER other than a mild headache, which she states she's had off and on for the last 2 weeks likely because she feels dehydrated. In addition, patient reports she has passed out like this before at times and she has not been eating enough. She denies pregnancy. No abdominal pain. No fever or chills. No neck pain.  No numbness or tingling. No weakness in arms or legs. No use of alcohol.  Past Medical History  Diagnosis Date  . Depression   . Anxiety   . Asthma     There are no active problems to display for this patient.   History reviewed. No pertinent past surgical history.  Current Outpatient Rx  Name  Route  Sig  Dispense  Refill  . citalopram (CELEXA) 20 MG tablet   Oral   Take 20 mg by mouth daily.         . clonazePAM (KLONOPIN) 0.5 MG tablet   Oral   Take 0.5 mg by mouth 2 (two) times daily.         Marland Kitchen HYDROcodone-acetaminophen (NORCO/VICODIN) 5-325 MG per tablet   Oral   Take 2 tablets by mouth every 4 (four) hours as  needed.   6 tablet   0   . miconazole (MONISTAT 7) 2 % vaginal cream   Vaginal   Place 1 Applicatorful vaginally daily.         . ondansetron (ZOFRAN ODT) 4 MG disintegrating tablet   Oral   Take 1 tablet (4 mg total) by mouth every 8 (eight) hours as needed for nausea.   10 tablet   0     Allergies Review of patient's allergies indicates no known allergies.  No family history on file.  Social History History  Substance Use Topics  . Smoking status: Never Smoker   . Smokeless tobacco: Not on file  . Alcohol Use: No    Review of Systems Constitutional: No fever/chills Eyes: No visual changes. ENT: No sore throat. Cardiovascular: Denies chest pain. Respiratory: Denies shortness of breath. Gastrointestinal: No abdominal pain.  No nausea, no vomiting.  No diarrhea.  No constipation. Genitourinary: Negative for dysuria. No burning with urination, no abnormal on her. No vaginal discharge or bleeding. No pelvic pain. Musculoskeletal: Negative for back pain. Skin: Negative for rash. Neurological: No focal weakness or numbness. See history of present illness  10-point ROS otherwise negative.  ____________________________________________   PHYSICAL EXAM:  VITAL SIGNS: ED Triage Vitals  Enc Vitals Group     BP 01/19/15  1817 117/77 mmHg     Pulse Rate 01/19/15 1817 85     Resp 01/19/15 1817 20     Temp 01/19/15 1817 98.5 F (36.9 C)     Temp Source 01/19/15 1817 Oral     SpO2 01/19/15 1817 98 %     Weight 01/19/15 1817 130 lb (58.968 kg)     Height 01/19/15 1817  (1.651 m)     Head Cir --      Peak Flow --      Pain Score 01/19/15 1824 0     Pain Loc --      Pain Edu? --      Excl. in GC? --     Constitutional: Alert and oriented. Well appearing and in no acute distress. Eyes: Conjunctivae are normal. PERRL. EOMI. Head: Atraumatic. Nose: No congestion/rhinnorhea. Mouth/Throat: Mucous membranes are moist.  Oropharynx non-erythematous. Neck: No  stridor.  There is no Cervical spine tenderness Cardiovascular: Normal rate, regular rhythm. Grossly normal heart sounds.  Good peripheral circulation. Respiratory: Normal respiratory effort.  No retractions. Lungs CTAB. Gastrointestinal: Soft and nontender. No distention. No abdominal bruits. No CVA tenderness. Musculoskeletal: No lower extremity tenderness nor edema.  No joint effusions. Neurologic:  Normal speech and language. No gross focal neurologic deficits are appreciated. Speech is normal. No gait instability. Normal cranial nerve exam. Moves all extremities with good strength. No sensory deficits. Skin:  Skin is warm, dry and intact. No rash noted. Psychiatric: Mood and affect are normal. Speech and behavior are normal.  ____________________________________________   LABS (all labs ordered are listed, but only abnormal results are displayed)  Labs Reviewed  BASIC METABOLIC PANEL - Abnormal; Notable for the following:    Creatinine, Ser 1.17 (*)    All other components within normal limits  CBC  PREGNANCY, URINE   ____________________________________________  EKG   Date: 01/19/2015  Rate: 77  Rhythm: normal sinus rhythm  QRS Axis: normal  Intervals: normal  ST/T Wave abnormalities: normal  Conduction Disutrbances: none  Narrative Interpretation: unremarkable     ____________________________________________  RADIOLOGY   ____________________________________________   PROCEDURES  Procedure(s) performed: None  Critical Care performed: No  ____________________________________________   INITIAL IMPRESSION / ASSESSMENT AND PLAN / ED COURSE  Pertinent labs & imaging results that were available during my care of the patient were reviewed by me and considered in my medical decision making (see chart for details).  Syncope. Appears to be vasovagal in nature, likely due to some dehydration and poor by mouth intake over the last week or so. The patient is quite  stable with normal neurologic exam, chest pain-free, no significant abnormalities on exam. We will hydrate her generously. She is currently eating a meal and water. She has a history of syncope in the past. EKG does not indicate any acute cardiac disease. No familiar history of arrhythmia.   ----------------------------------------- 8:30 PM on 01/19/2015 -----------------------------------------  Patient remains awake and alert without any difficulty. Her labs are reassuring. At this point, it appears consistent with vasovagal syncope secondary to poor by mouth intake. I discussed with the patient good hydration and nutrition. She agrees to continue to hydrate well and make sure that she is drinking plenty of water, at least 8 cups a day.  I discussed return precautions with the patient. Once she completes her IV fluids, I plan to discharge her home. He can follow-up with her primary care physician.  ____________________________________________   FINAL CLINICAL IMPRESSION(S) / ED DIAGNOSES  Final  diagnoses:  Vasovagal syncope      Sharyn CreamerMark Toney Difatta, MD 01/20/15 [phone]

## 2015-01-19 NOTE — Discharge Instructions (Signed)
Vasovagal Syncope, Adult  Follow-up with your doctor or [facility] within 1-2 days for reevaluation. Return to the ER right away if you develop another episode of passing out, fever, weakness, vomiting, chest pain, severe headache, or other new concerns arise.  Syncope, commonly known as fainting, is a temporary loss of consciousness. It occurs when the blood flow to the brain is reduced. Vasovagal syncope (also called neurocardiogenic syncope) is a fainting spell in which the blood flow to the brain is reduced because of a sudden drop in heart rate and blood pressure. Vasovagal syncope occurs when the brain and the cardiovascular system (blood vessels) do not adequately communicate and respond to each other. This is the most common cause of fainting. It often occurs in response to fear or some other type of emotional or physical stress. The body has a reaction in which the heart starts beating too slowly or the blood vessels expand, reducing blood pressure. This type of fainting spell is generally considered harmless. However, injuries can occur if a person takes a sudden fall during a fainting spell.  CAUSES  Vasovagal syncope occurs when a person's blood pressure and heart rate decrease suddenly, usually in response to a trigger. Many things and situations can trigger an episode. Some of these include:   Pain.   Fear.   The sight of blood or medical procedures, such as blood being drawn from a vein.   Common activities, such as coughing, swallowing, stretching, or going to the bathroom.   Emotional stress.   Prolonged standing, especially in a warm environment.   Lack of sleep or rest.   Prolonged lack of food.   Prolonged lack of fluids.   Recent illness.  The use of certain drugs that affect blood pressure, such as cocaine, alcohol, marijuana, inhalants, and opiates.  SYMPTOMS  Before the fainting episode, you may:   Feel dizzy or light headed.   Become  pale.  Sense that you are going to faint.   Feel like the room is spinning.   Have tunnel vision, only seeing directly in front of you.   Feel sick to your stomach (nauseous).   See spots or slowly lose vision.   Hear ringing in your ears.   Have a headache.   Feel warm and sweaty.   Feel a sensation of pins and needles. During the fainting spell, you will generally be unconscious for no longer than a couple minutes before waking up and returning to normal. If you get up too quickly before your body can recover, you may faint again. Some twitching or jerky movements may occur during the fainting spell.  DIAGNOSIS  Your caregiver will ask about your symptoms, take a medical history, and perform a physical exam. Various tests may be done to rule out other causes of fainting. These may include blood tests and tests to check the heart, such as electrocardiography, echocardiography, and possibly an electrophysiology study. When other causes have been ruled out, a test may be done to check the body's response to changes in position (tilt table test). TREATMENT  Most cases of vasovagal syncope do not require treatment. Your caregiver may recommend ways to avoid fainting triggers and may provide home strategies for preventing fainting. If you must be exposed to a possible trigger, you can drink additional fluids to help reduce your chances of having an episode of vasovagal syncope. If you have warning signs of an oncoming episode, you can respond by positioning yourself favorably (lying down).  If your fainting spells continue, you may be given medicines to prevent fainting. Some medicines may help make you more resistant to repeated episodes of vasovagal syncope. Special exercises or compression stockings may be recommended. In rare cases, the surgical placement of a pacemaker is considered. HOME CARE INSTRUCTIONS   Learn to identify the warning signs of vasovagal syncope.   Sit or lie  down at the first warning sign of a fainting spell. If sitting, put your head down between your legs. If you lie down, swing your legs up in the air to increase blood flow to the brain.   Avoid hot tubs and saunas.  Avoid prolonged standing.  Drink enough fluids to keep your urine clear or pale yellow. Avoid caffeine.  Increase salt in your diet as directed by your caregiver.   If you have to stand for a long time, perform movements such as:   Crossing your legs.   Flexing and stretching your leg muscles.   Squatting.   Moving your legs.   Bending over.   Only take over-the-counter or prescription medicines as directed by your caregiver. Do not suddenly stop any medicines without asking your caregiver first. SEEK MEDICAL CARE IF:   Your fainting spells continue or happen more frequently in spite of treatment.   You lose consciousness for more than a couple minutes.  You have fainting spells during or after exercising or after being startled.   You have new symptoms that occur with the fainting spells, such as:   Shortness of breath.  Chest pain.   Irregular heartbeat.   You have episodes of twitching or jerky movements that last longer than a few seconds.  You have episodes of twitching or jerky movements without obvious fainting. SEEK IMMEDIATE MEDICAL CARE IF:   You have injuries or bleeding after a fainting spell.   You have episodes of twitching or jerky movements that last longer than 5 minutes.   You have more than one spell of twitching or jerky movements before returning to consciousness after fainting. MAKE SURE YOU:   Understand these instructions.  Will watch your condition.  Will get help right away if you are not doing well or get worse. Document Released: 07/16/2012 Document Reviewed: 07/16/2012 [facility] Patient Information 2015 [place], [place]. This information is not intended to replace advice given to you by your health care  provider. Make sure you discuss any questions you have with your health care provider.

## 2015-04-06 ENCOUNTER — Encounter: Payer: Self-pay | Admitting: *Deleted

## 2015-04-06 ENCOUNTER — Emergency Department
Admission: EM | Admit: 2015-04-06 | Discharge: 2015-04-06 | Disposition: A | Discharge status: Home / Self Care | Payer: Medicaid Other | Attending: Emergency Medicine | Admitting: Emergency Medicine

## 2015-04-06 DIAGNOSIS — Z3202 Encounter for pregnancy test, result negative: Secondary | ICD-10-CM | POA: Insufficient documentation

## 2015-04-06 DIAGNOSIS — N946 Dysmenorrhea, unspecified: Secondary | ICD-10-CM | POA: Insufficient documentation

## 2015-04-06 LAB — HCG, QUANTITATIVE, PREGNANCY: hCG, Beta Chain, Quant, S: <1 m[IU]/mL (ref <5)

## 2015-04-06 MED ORDER — IBUPROFEN 800 MG PO TABS: 800.0000 mg | ORAL_TABLET | Freq: Three times a day (TID) | ORAL | Status: AC | PRN

## 2015-04-06 NOTE — ED Notes (Signed)
Patient reports "I was suppose to get my period this Sunday and took 2 pregnancy tests, one on Monday and one on Tuesday and both came back positive." Patient reports she began to have spotting today, reports had an ectopic pregnancy 1 year ago and believed she might be having another ectopic pregnancy. Patient denies chest pain, shortness of breath, N/V.  Patient reports of abdominal cramping. Patient alert and oriented x 4, respirations even and unlabored.

## 2015-04-06 NOTE — ED Provider Notes (Signed)
[facility] Emergency Department Provider Note     Time seen: ----------------------------------------- 7:31 PM on 04/06/2015 -----------------------------------------    I have reviewed the triage vital signs and the nursing notes.   HISTORY  Chief Complaint Vaginal Bleeding    HPI Olivia Hanson is a 24 y.o. female presents ER with lower abdominal cramping and vaginal bleeding. Patient states she is around [redacted] weeks pregnant with a positive home pregnant test. Is concerned because she had an ectopic pregnancy about a year ago. Denies any other complaints.   Past Medical History  Diagnosis Date  . Depression   . Anxiety   . Asthma     There are no active problems to display for this patient.   Past Surgical History  Procedure Laterality Date  . Ectopic pregnancy surgery      Allergies Review of patient's allergies indicates no known allergies.  Social History Social History  Substance Use Topics  . Smoking status: Never Smoker   . Smokeless tobacco: None  . Alcohol Use: No    Review of Systems Constitutional: Negative for fever. Eyes: Negative for visual changes. ENT: Negative for sore throat. Cardiovascular: Negative for chest pain. Respiratory: Negative for shortness of breath. Gastrointestinal: Negative for abdominal pain, vomiting and diarrhea. Genitourinary: Negative for dysuria. Positive for vaginal bleeding Musculoskeletal: Negative for back pain. Skin: Negative for rash. Neurological: Negative for headaches, focal weakness or numbness.  10-point ROS otherwise negative.  ____________________________________________   PHYSICAL EXAM:  VITAL SIGNS: ED Triage Vitals  Enc Vitals Group     BP 04/06/15 1809 151/90 mmHg     Pulse Rate 04/06/15 1809 66     Resp --      Temp 04/06/15 1809 98.2 F (36.8 C)     Temp Source 04/06/15 1809 Oral     SpO2 04/06/15 1809 100 %     Weight 04/06/15 1809 130 lb (58.968 kg)      Height 04/06/15 1809  (1.651 m)     Head Cir --      Peak Flow --      Pain Score 04/06/15 1809 6     Pain Loc --      Pain Edu? --      Excl. in GC? --     Constitutional: Alert and oriented. Well appearing and in no distress. Eyes: Conjunctivae are normal. PERRL. Normal extraocular movements. ENT   Head: Normocephalic and atraumatic.   Nose: No congestion/rhinnorhea.   Mouth/Throat: Mucous membranes are moist.   Neck: No stridor. Cardiovascular: Normal rate, regular rhythm. Normal and symmetric distal pulses are present in all extremities. No murmurs, rubs, or gallops. Respiratory: Normal respiratory effort without tachypnea nor retractions. Breath sounds are clear and equal bilaterally. No wheezes/rales/rhonchi. Gastrointestinal: Lower abdominal tenderness, no rebound or guarding. Normal bowel sounds. Musculoskeletal: Nontender with normal range of motion in all extremities. No joint effusions.  No lower extremity tenderness nor edema. Skin:  Skin is warm, dry and intact. No rash noted. Psychiatric: Mood and affect are normal. Speech and behavior are normal. Patient exhibits appropriate insight and judgment.  ____________________________________________  ED COURSE:  Pertinent labs & imaging results that were available during my care of the patient were reviewed by me and considered in my medical decision making (see chart for details). We'll ensure that she is pregnant, and reevaluate ____________________________________________    LABS (pertinent positives/negatives)  Labs Reviewed  HCG, QUANTITATIVE, PREGNANCY    ____________________________________________  FINAL ASSESSMENT AND PLAN  Dysmenorrhea  Plan: Patient with labs and imaging as dictated above. Patient likely with a false positive home pregnancy test. Serum hCG is negative. She stable for discharge with Motrin   Emily Filbert, MD   Emily Filbert, MD 04/06/15 [phone]

## 2015-04-06 NOTE — ED Notes (Signed)

## 2015-04-06 NOTE — ED Notes (Signed)
Pt reports being [redacted] weeks pregnant, pt starting spotting with abdominal pain today, pt reports having an ectopic pregnancy 1 year ago

## 2015-04-06 NOTE — Discharge Instructions (Signed)

## 2015-07-14 ENCOUNTER — Encounter: Payer: Self-pay | Admitting: Emergency Medicine

## 2015-07-14 ENCOUNTER — Emergency Department
Admission: EM | Admit: 2015-07-14 | Discharge: 2015-07-14 | Disposition: A | Discharge status: Home / Self Care | Payer: Self-pay | Attending: Emergency Medicine | Admitting: Emergency Medicine

## 2015-07-14 ENCOUNTER — Emergency Department: Payer: Medicaid Other

## 2015-07-14 DIAGNOSIS — J069 Acute upper respiratory infection, unspecified: Secondary | ICD-10-CM | POA: Insufficient documentation

## 2015-07-14 DIAGNOSIS — Z79899 Other long term (current) drug therapy: Secondary | ICD-10-CM | POA: Insufficient documentation

## 2015-07-14 DIAGNOSIS — F419 Anxiety disorder, unspecified: Secondary | ICD-10-CM | POA: Insufficient documentation

## 2015-07-14 DIAGNOSIS — J45901 Unspecified asthma with (acute) exacerbation: Secondary | ICD-10-CM | POA: Insufficient documentation

## 2015-07-14 LAB — CBC WITH DIFFERENTIAL/PLATELET
BASOS ABS: 0.1 10*3/uL (ref 0–0.1)
BASOS PCT: 1 %
EOS PCT: 1 %
Eosinophils Absolute: 0.1 10*3/uL (ref 0–0.7)
HEMATOCRIT: 41.4 % (ref 35.0–47.0)
Hemoglobin: 14 g/dL (ref 12.0–16.0)
LYMPHS PCT: 16 %
Lymphs Abs: 1.2 10*3/uL (ref 1.0–3.6)
MCH: 31.1 pg (ref 26.0–34.0)
MCHC: 33.8 g/dL (ref 32.0–36.0)
MCV: 91.9 fL (ref 80.0–100.0)
Monocytes Absolute: 0.8 10*3/uL (ref 0.2–0.9)
Monocytes Relative: 10 %
NEUTROS ABS: 5.4 10*3/uL (ref 1.4–6.5)
Neutrophils Relative %: 72 %
Platelets: 202 10*3/uL (ref 150–440)
RBC: 4.51 MIL/uL (ref 3.80–5.20)
RDW: 13.6 % (ref 11.5–14.5)
WBC: 7.5 10*3/uL (ref 3.6–11.0)

## 2015-07-14 LAB — BASIC METABOLIC PANEL
Anion gap: 7 (ref 5–15)
BUN: 10 mg/dL (ref 6–20)
CALCIUM: 9.3 mg/dL (ref 8.9–10.3)
CHLORIDE: 105 mmol/L (ref 101–111)
CO2: 28 mmol/L (ref 22–32)
CREATININE: 0.93 mg/dL (ref 0.44–1.00)
GFR calc Af Amer: >60 mL/min (ref >60)
GFR calc non Af Amer: >60 mL/min (ref >60)
GLUCOSE: 99 mg/dL (ref 65–99)
Potassium: 3.8 mmol/L (ref 3.5–5.1)
Sodium: 140 mmol/L (ref 135–145)

## 2015-07-14 MED ORDER — LORAZEPAM 1 MG PO TABS
1.0000 mg | ORAL_TABLET | Freq: Once | ORAL | Status: AC
  Administered 2015-07-14: 1 mg via ORAL

## 2015-07-14 MED ORDER — LORAZEPAM 1 MG PO TABS
ORAL_TABLET | ORAL | Status: AC
  Filled 2015-07-14: qty 1

## 2015-07-14 MED ORDER — OPTICHAMBER ADVANTAGE MISC: 1.0000 | Freq: Once | Status: AC

## 2015-07-14 MED ORDER — ACETAMINOPHEN 500 MG PO TABS
1000.0000 mg | ORAL_TABLET | Freq: Once | ORAL | Status: AC
  Administered 2015-07-14: 1000 mg via ORAL

## 2015-07-14 MED ORDER — ALBUTEROL SULFATE (2.5 MG/3ML) 0.083% IN NEBU
INHALATION_SOLUTION | RESPIRATORY_TRACT | Status: AC
  Filled 2015-07-14: qty 6

## 2015-07-14 MED ORDER — ALBUTEROL SULFATE (2.5 MG/3ML) 0.083% IN NEBU
5.0000 mg | INHALATION_SOLUTION | Freq: Once | RESPIRATORY_TRACT | Status: AC
  Administered 2015-07-14: 5 mg via RESPIRATORY_TRACT

## 2015-07-14 MED ORDER — PREDNISONE 20 MG PO TABS
40.0000 mg | ORAL_TABLET | Freq: Once | ORAL | Status: AC
  Administered 2015-07-14: 40 mg via ORAL
  Filled 2015-07-14: qty 2

## 2015-07-14 MED ORDER — PREDNISONE 20 MG PO TABS: ORAL_TABLET | ORAL | Status: AC

## 2015-07-14 MED ORDER — ACETAMINOPHEN 500 MG PO TABS
ORAL_TABLET | ORAL | Status: AC
  Administered 2015-07-14: 1000 mg via ORAL
  Filled 2015-07-14: qty 2

## 2015-07-14 NOTE — ED Notes (Signed)
Pt brought to Rm 3.  Pt appears anxious and tearful.  Pt dressed in gown and placed on monitor

## 2015-07-14 NOTE — ED Notes (Signed)
Patient presents to the ED with cough and congestion x 3 days.  Patient states, "I feel like I've been running a marathon".  Patient has asthma which has been acerbated with her congestion.  Patient is slightly tachypnic in triage but speaking in full sentences without obvious difficulty.

## 2015-07-14 NOTE — Discharge Instructions (Signed)
Chest x-ray looked okay. You do not have pneumonia or other acute issues. Continue to use her albuterol. Use a spacer with your inhaler. Take prednisone as prescribed.  Return to the emergency department if you have worsening shortness of breath or other urgent concerns.  Upper Respiratory Infection, Adult Most upper respiratory infections (URIs) are a viral infection of the air passages leading to the lungs. A URI affects the nose, throat, and upper air passages. The most common type of URI is nasopharyngitis and is typically referred to as "the common cold." URIs run their course and usually go away on their own. Most of the time, a URI does not require medical attention, but sometimes a bacterial infection in the upper airways can follow a viral infection. This is called a secondary infection. Sinus and middle ear infections are common types of secondary upper respiratory infections. Bacterial pneumonia can also complicate a URI. A URI can worsen asthma and chronic obstructive pulmonary disease (COPD). Sometimes, these complications can require emergency medical care and may be life threatening.  CAUSES Almost all URIs are caused by viruses. A virus is a type of germ and can spread from one person to another.  RISKS FACTORS You may be at risk for a URI if:   You smoke.   You have chronic heart or lung disease.  You have a weakened defense (immune) system.   You are very young or very old.   You have nasal allergies or asthma.  You work in crowded or poorly ventilated areas.  You work in health care facilities or schools. SIGNS AND SYMPTOMS  Symptoms typically develop 2-3 days after you come in contact with a cold virus. Most viral URIs last 7-10 days. However, viral URIs from the influenza virus (flu virus) can last 14-18 days and are typically more severe. Symptoms may include:   Runny or stuffy (congested) nose.   Sneezing.   Cough.   Sore throat.   Headache.    Fatigue.   Fever.   Loss of appetite.   Pain in your forehead, behind your eyes, and over your cheekbones (sinus pain).  Muscle aches.  DIAGNOSIS  Your health care provider may diagnose a URI by:  Physical exam.  Tests to check that your symptoms are not due to another condition such as:  Strep throat.  Sinusitis.  Pneumonia.  Asthma. TREATMENT  A URI goes away on its own with time. It cannot be cured with medicines, but medicines may be prescribed or recommended to relieve symptoms. Medicines may help:  Reduce your fever.  Reduce your cough.  Relieve nasal congestion. HOME CARE INSTRUCTIONS   Take medicines only as directed by your health care provider.   Gargle warm saltwater or take cough drops to comfort your throat as directed by your health care provider.  Use a warm mist humidifier or inhale steam from a shower to increase air moisture. This may make it easier to breathe.  Drink enough fluid to keep your urine clear or pale yellow.   Eat soups and other clear broths and maintain good nutrition.   Rest as needed.   Return to work when your temperature has returned to normal or as your health care provider advises. You may need to stay home longer to avoid infecting others. You can also use a face mask and careful hand washing to prevent spread of the virus.  Increase the usage of your inhaler if you have asthma.   Do not use any tobacco  products, including cigarettes, chewing tobacco, or electronic cigarettes. If you need help quitting, ask your health care provider. PREVENTION  The best way to protect yourself from getting a cold is to practice good hygiene.   Avoid oral or hand contact with people with cold symptoms.   Wash your hands often if contact occurs.  There is no clear evidence that vitamin C, vitamin E, echinacea, or exercise reduces the chance of developing a cold. However, it is always recommended to get plenty of rest,  exercise, and practice good nutrition.  SEEK MEDICAL CARE IF:   You are getting worse rather than better.   Your symptoms are not controlled by medicine.   You have chills.  You have worsening shortness of breath.  You have brown or red mucus.  You have yellow or brown nasal discharge.  You have pain in your face, especially when you bend forward.  You have a fever.  You have swollen neck glands.  You have pain while swallowing.  You have white areas in the back of your throat. SEEK IMMEDIATE MEDICAL CARE IF:   You have severe or persistent:  Headache.  Ear pain.  Sinus pain.  Chest pain.  You have chronic lung disease and any of the following:  Wheezing.  Prolonged cough.  Coughing up blood.  A change in your usual mucus.  You have a stiff neck.  You have changes in your:  Vision.  Hearing.  Thinking.  Mood. MAKE SURE YOU:   Understand these instructions.  Will watch your condition.  Will get help right away if you are not doing well or get worse.   This information is not intended to replace advice given to you by your health care provider. Make sure you discuss any questions you have with your health care provider.   Document Released: 01/23/2001 Document Revised: 12/14/2014 Document Reviewed: 11/04/2013 Elsevier Interactive Patient Education [employer].

## 2015-07-14 NOTE — ED Provider Notes (Signed)
[facility] Emergency Department Provider Note  ____________________________________________  Time seen: 1821  I have reviewed the triage vital signs and the nursing notes.  History by:  Patient  HISTORY  Chief Complaint Nasal Congestion and Cough     HPI Olivia Hanson is a 24 y.o. female with a history of asthma who reports that she has been having nasal congestion, sore throat, and some shortness of breath for 2 days. She denies any fever, though she does report that she has some cold spells. She presented to the emergency department because of shortness of breath. She has an albuterol inhaler at home, but has not been using it. She has taken Mucinex for cough and congestion as well as Sudafed. I suspect she has been taking more decongestant then she realized because of this duplication.  She also has a history of anxiety. The female companion with her confirms that she is eating anxious right now. She reports she is also having some chest pain, like having pins or needles on her chest. This began after triage and lasted for about 5-10 minutes. She is not having the symptoms now.  The female companion with her also has similar congestion.   Past Medical History  Diagnosis Date  . Depression   . Anxiety   . Asthma     There are no active problems to display for this patient.   Past Surgical History  Procedure Laterality Date  . Ectopic pregnancy surgery      Current Outpatient Rx  Name  Route  Sig  Dispense  Refill  . citalopram (CELEXA) 20 MG tablet   Oral   Take 20 mg by mouth daily.         . clonazePAM (KLONOPIN) 0.5 MG tablet   Oral   Take 0.5 mg by mouth 2 (two) times daily.         Marland Kitchen HYDROcodone-acetaminophen (NORCO/VICODIN) 5-325 MG per tablet   Oral   Take 2 tablets by mouth every 4 (four) hours as needed.   6 tablet   0   . ibuprofen (ADVIL,MOTRIN) 800 MG tablet   Oral   Take 1 tablet (800 mg total) by mouth every 8  (eight) hours as needed.   30 tablet   0   . miconazole (MONISTAT 7) 2 % vaginal cream   Vaginal   Place 1 Applicatorful vaginally daily.         . ondansetron (ZOFRAN ODT) 4 MG disintegrating tablet   Oral   Take 1 tablet (4 mg total) by mouth every 8 (eight) hours as needed for nausea.   10 tablet   0   . predniSONE (DELTASONE) 20 MG tablet      Take 2 tablets the first day, then take one tablet a day for 4 more days.   6 tablet   0   . Spacer/Aero-Holding Chambers (OPTICHAMBER ADVANTAGE) MISC   Other   1 each by Other route once. Always uses her when you're using a metered-dose inhaler. You've aromatase medicine as much, he won't have his much side effect, but you it twice as much medicine and your lungs.   1 each   0     Allergies Review of patient's allergies indicates no known allergies.  No family history on file.  Social History Social History  Substance Use Topics  . Smoking status: Never Smoker   . Smokeless tobacco: None  . Alcohol Use: No    Review of Systems  Constitutional: Positive for chills. ENT: Positive for congestion. Cardiovascular: Negative for chest pain. Respiratory: Positive for cough and shortness of breath. See history of present illness Gastrointestinal: Negative for abdominal pain, vomiting and diarrhea. Genitourinary: Negative for dysuria. Musculoskeletal: No back pain. Skin: Negative for rash. Neurological: Negative for headache or focal weakness Psychiatric: Anxiety, acute on chronic. See history of present illness  10-point ROS otherwise negative.  ____________________________________________   PHYSICAL EXAM:  VITAL SIGNS: ED Triage Vitals  Enc Vitals Group     BP 07/14/15 1709 149/85 mmHg     Pulse Rate 07/14/15 1709 92     Resp 07/14/15 1709 24     Temp 07/14/15 1709 98.1 F (36.7 C)     Temp src --      SpO2 07/14/15 1709 100 %     Weight 07/14/15 1709 125 lb (56.7 kg)     Height 07/14/15 1709 5\' 5"  (1.651  m)     Head Cir --      Peak Flow --      Pain Score 07/14/15 1718 0     Pain Loc --      Pain Edu? --      Excl. in GC? --     Constitutional: Alert and oriented. Appears anxious, tachypnea. ENT   Head: Normocephalic and atraumatic.   Nose: No congestion/rhinnorhea.       Mouth: Mild erythema. No tonsillar swelling or discharge.  Cardiovascular: Normal rate at 84, regular rhythm, no murmur noted Respiratory:  Patient appears to Neck.    Breath sounds overall sound clear, though the exam is somewhat limited by the patient's tachypnea and anxiety.  Gastrointestinal: Soft, no distention. Nontender Back: No muscle spasm, no tenderness, no CVA tenderness. Musculoskeletal: No deformity noted. Nontender with normal range of motion in all extremities.  No noted edema. Neurologic:  Communicative. Normal appearing spontaneous movement in all 4 extremities. No gross focal neurologic deficits are appreciated.  Skin:  Skin is warm, dry. No rash noted. Psychiatric: Patient appears anxious. She is alert and communicative and cooperative.  ____________________________________________    LABS (pertinent positives/negatives)  Labs Reviewed  CBC WITH DIFFERENTIAL/PLATELET  BASIC METABOLIC PANEL     ____________________________________________   EKG  ED ECG REPORT I, Lurleen Soltero W, the attending physician, personally viewed and interpreted this ECG.   Date: 07/14/2015  EKG Time: 1804  Rate: 95  Rhythm:  Normal sinus rhythm  Axis: Normal  Intervals: Normal  ST&T Change: None noted   ____________________________________________    RADIOLOGY  Chest x-ray:  FINDINGS: Lungs are mildly hyperinflated. Heart size is normal. Lungs are clear. No pulmonary edema. Visualized osseous structures have a normal appearance.  IMPRESSION: Mild  hyperinflation. ____________________________________________   PROCEDURES    ____________________________________________   INITIAL IMPRESSION / ASSESSMENT AND PLAN / ED COURSE  Pertinent labs & imaging results that were available during my care of the patient were reviewed by me and considered in my medical decision making (see chart for details).  Well-nourished well-developed 24 year old female who appears anxious and somewhat tachypnea. Her vital signs are overall reasonable with a good oxygen saturation level. She does appear anxious. She does report that the albuterol breathing treatment she received before my evaluation helped her breathing. We will check a chest x-ray and basic labs.  I will treat the patient with 1 mg of Ativan by mouth due to her anxiety.  ----------------------------------------- 8:27 PM on 07/14/2015 -----------------------------------------  Chest x-ray does not show any acute significant problems. No  infiltrate. Blood tests are within normal limits. Patient is more comfortable and breathing well now. Will discharge her with a prescription for prednisone. I counseled her to continue the use of albuterol.   ____________________________________________   FINAL CLINICAL IMPRESSION(S) / ED DIAGNOSES  Final diagnoses:  Upper respiratory tract infection  Anxiety      Darien Ramus, MD 07/14/15 2031

## 2015-08-10 ENCOUNTER — Emergency Department: Payer: No Typology Code available for payment source

## 2015-08-10 ENCOUNTER — Emergency Department
Admission: EM | Admit: 2015-08-10 | Discharge: 2015-08-10 | Disposition: A | Discharge status: Home / Self Care | Payer: No Typology Code available for payment source | Attending: Emergency Medicine | Admitting: Emergency Medicine

## 2015-08-10 ENCOUNTER — Encounter: Payer: Self-pay | Admitting: *Deleted

## 2015-08-10 DIAGNOSIS — Z3202 Encounter for pregnancy test, result negative: Secondary | ICD-10-CM | POA: Diagnosis not present

## 2015-08-10 DIAGNOSIS — F17200 Nicotine dependence, unspecified, uncomplicated: Secondary | ICD-10-CM | POA: Diagnosis not present

## 2015-08-10 DIAGNOSIS — Y92410 Unspecified street and highway as the place of occurrence of the external cause: Secondary | ICD-10-CM | POA: Insufficient documentation

## 2015-08-10 DIAGNOSIS — Z79899 Other long term (current) drug therapy: Secondary | ICD-10-CM | POA: Diagnosis not present

## 2015-08-10 DIAGNOSIS — Y998 Other external cause status: Secondary | ICD-10-CM | POA: Diagnosis not present

## 2015-08-10 DIAGNOSIS — Y9389 Activity, other specified: Secondary | ICD-10-CM | POA: Diagnosis not present

## 2015-08-10 DIAGNOSIS — M7541 Impingement syndrome of right shoulder: Secondary | ICD-10-CM | POA: Diagnosis not present

## 2015-08-10 DIAGNOSIS — M62830 Muscle spasm of back: Secondary | ICD-10-CM

## 2015-08-10 DIAGNOSIS — F419 Anxiety disorder, unspecified: Secondary | ICD-10-CM | POA: Diagnosis not present

## 2015-08-10 DIAGNOSIS — S4991XA Unspecified injury of right shoulder and upper arm, initial encounter: Secondary | ICD-10-CM | POA: Diagnosis present

## 2015-08-10 MED ORDER — ALPRAZOLAM 0.5 MG PO TABS
0.5000 mg | ORAL_TABLET | Freq: Once | ORAL | Status: AC
  Administered 2015-08-10: 0.5 mg via ORAL
  Filled 2015-08-10: qty 1

## 2015-08-10 MED ORDER — OXYCODONE-ACETAMINOPHEN 5-325 MG PO TABS
1.0000 | ORAL_TABLET | Freq: Once | ORAL | Status: AC
  Administered 2015-08-10: 1 via ORAL
  Filled 2015-08-10: qty 1

## 2015-08-10 MED ORDER — MELOXICAM 15 MG PO TABS: 15.0000 mg | ORAL_TABLET | Freq: Every day | ORAL | Status: AC

## 2015-08-10 MED ORDER — CYCLOBENZAPRINE HCL 10 MG PO TABS: 10.0000 mg | ORAL_TABLET | Freq: Three times a day (TID) | ORAL | Status: AC | PRN
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
## 2015-08-11 LAB — POCT PREGNANCY, URINE: Preg Test, Ur: NEGATIVE

## 2015-08-14 ENCOUNTER — Other Ambulatory Visit: Payer: Self-pay

## 2015-08-14 ENCOUNTER — Encounter: Payer: Self-pay | Admitting: Emergency Medicine

## 2015-08-14 ENCOUNTER — Emergency Department: Payer: Self-pay

## 2015-08-14 ENCOUNTER — Emergency Department
Admission: EM | Admit: 2015-08-14 | Discharge: 2015-08-14 | Disposition: A | Discharge status: Home / Self Care | Payer: Self-pay | Attending: Emergency Medicine | Admitting: Emergency Medicine

## 2015-08-14 DIAGNOSIS — Z791 Long term (current) use of non-steroidal anti-inflammatories (NSAID): Secondary | ICD-10-CM | POA: Insufficient documentation

## 2015-08-14 DIAGNOSIS — G8929 Other chronic pain: Secondary | ICD-10-CM | POA: Insufficient documentation

## 2015-08-14 DIAGNOSIS — Z79899 Other long term (current) drug therapy: Secondary | ICD-10-CM | POA: Insufficient documentation

## 2015-08-14 DIAGNOSIS — R51 Headache: Secondary | ICD-10-CM | POA: Insufficient documentation

## 2015-08-14 DIAGNOSIS — Z7952 Long term (current) use of systemic steroids: Secondary | ICD-10-CM | POA: Insufficient documentation

## 2015-08-14 DIAGNOSIS — R0602 Shortness of breath: Secondary | ICD-10-CM

## 2015-08-14 DIAGNOSIS — F17200 Nicotine dependence, unspecified, uncomplicated: Secondary | ICD-10-CM | POA: Insufficient documentation

## 2015-08-14 DIAGNOSIS — R064 Hyperventilation: Secondary | ICD-10-CM | POA: Insufficient documentation

## 2015-08-14 DIAGNOSIS — J45901 Unspecified asthma with (acute) exacerbation: Secondary | ICD-10-CM | POA: Insufficient documentation

## 2015-08-14 DIAGNOSIS — F410 Panic disorder [episodic paroxysmal anxiety] without agoraphobia: Secondary | ICD-10-CM | POA: Insufficient documentation

## 2015-08-14 LAB — CBC WITH DIFFERENTIAL/PLATELET
BASOS PCT: 1 %
Basophils Absolute: 0.1 10*3/uL (ref 0–0.1)
Eosinophils Absolute: 0.3 10*3/uL (ref 0–0.7)
Eosinophils Relative: 3 %
HEMATOCRIT: 38.1 % (ref 35.0–47.0)
HEMOGLOBIN: 12.9 g/dL (ref 12.0–16.0)
LYMPHS ABS: 2 10*3/uL (ref 1.0–3.6)
LYMPHS PCT: 16 %
MCH: 30.9 pg (ref 26.0–34.0)
MCHC: 33.9 g/dL (ref 32.0–36.0)
MCV: 91 fL (ref 80.0–100.0)
MONO ABS: 1.1 10*3/uL — AB (ref 0.2–0.9)
MONOS PCT: 10 %
NEUTROS ABS: 8.6 10*3/uL — AB (ref 1.4–6.5)
NEUTROS PCT: 70 %
Platelets: 233 10*3/uL (ref 150–440)
RBC: 4.18 MIL/uL (ref 3.80–5.20)
RDW: 13.6 % (ref 11.5–14.5)
WBC: 12.1 10*3/uL — ABNORMAL HIGH (ref 3.6–11.0)

## 2015-08-14 LAB — BASIC METABOLIC PANEL
ANION GAP: 6 (ref 5–15)
BUN: 11 mg/dL (ref 6–20)
CHLORIDE: 107 mmol/L (ref 101–111)
CO2: 25 mmol/L (ref 22–32)
Calcium: 8.8 mg/dL — ABNORMAL LOW (ref 8.9–10.3)
Creatinine, Ser: 0.82 mg/dL (ref 0.44–1.00)
GFR calc non Af Amer: >60 mL/min (ref >60)
GLUCOSE: 89 mg/dL (ref 65–99)
Potassium: 3.4 mmol/L — ABNORMAL LOW (ref 3.5–5.1)
Sodium: 138 mmol/L (ref 135–145)

## 2015-08-14 LAB — FIBRIN DERIVATIVES D-DIMER (ARMC ONLY): Fibrin derivatives D-dimer (ARMC): 685 — ABNORMAL HIGH (ref 0–499)

## 2015-08-14 MED ORDER — KETOROLAC TROMETHAMINE 30 MG/ML IJ SOLN
30.0000 mg | Freq: Once | INTRAMUSCULAR | Status: AC
  Administered 2015-08-14: 30 mg via INTRAVENOUS
  Filled 2015-08-14: qty 1

## 2015-08-14 MED ORDER — IOHEXOL 350 MG/ML SOLN
75.0000 mL | Freq: Once | INTRAVENOUS | Status: AC | PRN
  Administered 2015-08-14: 75 mL via INTRAVENOUS

## 2015-08-14 MED ORDER — LORAZEPAM 1 MG PO TABS: 1.0000 mg | ORAL_TABLET | Freq: Two times a day (BID) | ORAL | Status: AC | PRN

## 2015-08-14 MED ORDER — LORAZEPAM 2 MG/ML IJ SOLN
1.0000 mg | Freq: Once | INTRAMUSCULAR | Status: AC
  Administered 2015-08-14: 1 mg via INTRAVENOUS
  Filled 2015-08-14: qty 1

## 2015-08-14 NOTE — ED Notes (Signed)
Pt verbalized understanding of discharge instructions. NAD at this time. 
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
## 2015-08-14 NOTE — Discharge Instructions (Signed)
Panic Attacks °Panic attacks are sudden, short-lived surges of severe anxiety, fear, or discomfort. They may occur for no reason when you are relaxed, when you are anxious, or when you are sleeping. Panic attacks may occur for a number of reasons:  °· Healthy people occasionally have panic attacks in extreme, life-threatening situations, such as war or natural disasters. Normal anxiety is a protective mechanism of the body that helps us react to danger (fight or flight response). °· Panic attacks are often seen with anxiety disorders, such as panic disorder, social anxiety disorder, generalized anxiety disorder, and phobias. Anxiety disorders cause excessive or uncontrollable anxiety. They may interfere with your relationships or other life activities. °· Panic attacks are sometimes seen with other mental illnesses, such as depression and posttraumatic stress disorder. °· Certain medical conditions, prescription medicines, and drugs of abuse can cause panic attacks. °SYMPTOMS  °Panic attacks start suddenly, peak within 20 minutes, and are accompanied by four or more of the following symptoms: °· Pounding heart or fast heart rate (palpitations). °· Sweating. °· Trembling or shaking. °· Shortness of breath or feeling smothered. °· Feeling choked. °· Chest pain or discomfort. °· Nausea or strange feeling in your stomach. °· Dizziness, light-headedness, or feeling like you will faint. °· Chills or hot flushes. °· Numbness or tingling in your lips or hands and feet. °· Feeling that things are not real or feeling that you are not yourself. °· Fear of losing control or going crazy. °· Fear of dying. °Some of these symptoms can mimic serious medical conditions. For example, you may think you are having a heart attack. Although panic attacks can be very scary, they are not life threatening. °DIAGNOSIS  °Panic attacks are diagnosed through an assessment by your health care provider. Your health care provider will ask  questions about your symptoms, such as where and when they occurred. Your health care provider will also ask about your medical history and use of alcohol and drugs, including prescription medicines. Your health care provider may order blood tests or other studies to rule out a serious medical condition. Your health care provider may refer you to a mental health professional for further evaluation. °TREATMENT  °· Most healthy people who have one or two panic attacks in an extreme, life-threatening situation will not require treatment. °· The treatment for panic attacks associated with anxiety disorders or other mental illness typically involves counseling with a mental health professional, medicine, or a combination of both. Your health care provider will help determine what treatment is best for you. °· Panic attacks due to physical illness usually go away with treatment of the illness. If prescription medicine is causing panic attacks, talk with your health care provider about stopping the medicine, decreasing the dose, or substituting another medicine. °· Panic attacks due to alcohol or drug abuse go away with abstinence. Some adults need professional help in order to stop drinking or using drugs. °HOME CARE INSTRUCTIONS  °· Take all medicines as directed by your health care provider.   °· Schedule and attend follow-up visits as directed by your health care provider. It is important to keep all your appointments. °SEEK MEDICAL CARE IF: °· You are not able to take your medicines as prescribed. °· Your symptoms do not improve or get worse. °SEEK IMMEDIATE MEDICAL CARE IF:  °· You experience panic attack symptoms that are different than your usual symptoms. °· You have serious thoughts about hurting yourself or others. °· You are taking medicine for panic attacks and   have a serious side effect. MAKE SURE YOU:  Understand these instructions.  Will watch your condition.  Will get help right away if you are not  doing well or get worse.   This information is not intended to replace advice given to you by your health care provider. Make sure you discuss any questions you have with your health care provider.   Document Released: 07/30/2005 Document Revised: 08/04/2013 Document Reviewed: 03/13/2013 Elsevier Interactive Patient Education 2016 [employer]  Please see can establish outpatient primary care. He may also want to speak to a psychiatrist to help you with anxiety/depression syndrome

## 2015-08-14 NOTE — ED Notes (Signed)
Patient presents to Emergency Department via EMS with complaints of SOB, pt hx of asthma and anxiety.  Pt use albuterol  Neb once per day..  Pt takes daily PER EMS "drama" at pt's home, mother not allowed to get info on pt.

## 2015-08-14 NOTE — ED Provider Notes (Signed)
See previous dictation  Jennye MoccasinBrian S Quigley, MD 08/14/15 1216

## 2015-10-08 ENCOUNTER — Encounter: Payer: Self-pay | Admitting: Emergency Medicine

## 2015-10-08 ENCOUNTER — Emergency Department
Admission: EM | Admit: 2015-10-08 | Discharge: 2015-10-08 | Disposition: A | Discharge status: Home / Self Care | Payer: No Typology Code available for payment source | Attending: Emergency Medicine | Admitting: Emergency Medicine

## 2015-10-08 DIAGNOSIS — R519 Headache, unspecified: Secondary | ICD-10-CM

## 2015-10-08 DIAGNOSIS — Z79899 Other long term (current) drug therapy: Secondary | ICD-10-CM | POA: Insufficient documentation

## 2015-10-08 DIAGNOSIS — Z87891 Personal history of nicotine dependence: Secondary | ICD-10-CM | POA: Insufficient documentation

## 2015-10-08 DIAGNOSIS — R002 Palpitations: Secondary | ICD-10-CM | POA: Insufficient documentation

## 2015-10-08 DIAGNOSIS — R51 Headache: Secondary | ICD-10-CM | POA: Insufficient documentation

## 2015-10-08 MED ORDER — METOCLOPRAMIDE HCL 5 MG/ML IJ SOLN
10.0000 mg | Freq: Once | INTRAMUSCULAR | Status: AC
  Administered 2015-10-08: 10 mg via INTRAVENOUS
  Filled 2015-10-08: qty 2

## 2015-10-08 MED ORDER — SODIUM CHLORIDE 0.9 % IV BOLUS (SEPSIS)
1000.0000 mL | Freq: Once | INTRAVENOUS | Status: AC
  Administered 2015-10-08: 1000 mL via INTRAVENOUS

## 2015-10-08 MED ORDER — ONDANSETRON HCL 4 MG/2ML IJ SOLN
4.0000 mg | Freq: Once | INTRAMUSCULAR | Status: AC
  Administered 2015-10-08: 4 mg via INTRAVENOUS
  Filled 2015-10-08: qty 2

## 2015-10-08 MED ORDER — KETOROLAC TROMETHAMINE 30 MG/ML IJ SOLN
30.0000 mg | Freq: Once | INTRAMUSCULAR | Status: AC
  Administered 2015-10-08: 30 mg via INTRAVENOUS
  Filled 2015-10-08: qty 1

## 2015-10-08 NOTE — Discharge Instructions (Signed)
You were evaluated for headache, and although no certain cause was found, I suspect migraine headache. We discussed, avoid fragrances and chemicals such as plug-in's.  Return to the emergency department for any worsening symptoms including confusion altered mental status, new weakness or numbness, vision problems, chest pain, or any other symptoms concerning to you.   General Headache Without Cause A headache is pain or discomfort felt around the head or neck area. There are many causes and types of headaches. In some cases, the cause may not be found.  HOME CARE  Managing Pain  Take over-the-counter and prescription medicines only as told by your doctor.  Lie down in a dark, quiet room when you have a headache.  If directed, apply ice to the head and neck area:  Put ice in a plastic bag.  Place a towel between your skin and the bag.  Leave the ice on for 20 minutes, 2-3 times per day.  Use a heating pad or hot shower to apply heat to the head and neck area as told by your doctor.  Keep lights dim if bright lights bother you or make your headaches worse. Eating and Drinking  Eat meals on a regular schedule.  Lessen how much alcohol you drink.  Lessen how much caffeine you drink, or stop drinking caffeine. General Instructions  Keep all follow-up visits as told by your doctor. This is important.  Keep a journal to find out if certain things bring on headaches. For example, write down:  What you eat and drink.  How much sleep you get.  Any change to your diet or medicines.  Relax by getting a massage or doing other relaxing activities.  Lessen stress.  Sit up straight. Do not tighten (tense) your muscles.  Do not use tobacco products. This includes cigarettes, chewing tobacco, or e-cigarettes. If you need help quitting, ask your doctor.  Exercise regularly as told by your doctor.  Get enough sleep. This often means 7-9 hours of sleep. GET HELP IF:  Your symptoms  are not helped by medicine.  You have a headache that feels different than the other headaches.  You feel sick to your stomach (nauseous) or you throw up (vomit).  You have a fever. GET HELP RIGHT AWAY IF:   Your headache becomes really bad.  You keep throwing up.  You have a stiff neck.  You have trouble seeing.  You have trouble speaking.  You have pain in the eye or ear.  Your muscles are weak or you lose muscle control.  You lose your balance or have trouble walking.  You feel like you will pass out (faint) or you pass out.  You have confusion.   This information is not intended to replace advice given to you by your health care provider. Make sure you discuss any questions you have with your health care provider.   Document Released: 05/08/2008 Document Revised: 04/20/2015 Document Reviewed: 11/22/2014 Elsevier Interactive Patient Education 2016 [employer].  Migraine Headache A migraine headache is an intense, throbbing pain on one or both sides of your head. A migraine can last for 30 minutes to several hours. CAUSES  The exact cause of a migraine headache is not always known. However, a migraine may be caused when nerves in the brain become irritated and release chemicals that cause inflammation. This causes pain. Certain things may also trigger migraines, such as:  Alcohol.  Smoking.  Stress.  Menstruation.  Aged cheeses.  Foods or drinks that contain  nitrates, glutamate, aspartame, or tyramine.  Lack of sleep.  Chocolate.  Caffeine.  Hunger.  Physical exertion.  Fatigue.  Medicines used to treat chest pain (nitroglycerine), birth control pills, estrogen, and some blood pressure medicines. SIGNS AND SYMPTOMS  Pain on one or both sides of your head.  Pulsating or throbbing pain.  Severe pain that prevents daily activities.  Pain that is aggravated by any physical activity.  Nausea, vomiting, or both.  Dizziness.  Pain with  exposure to bright lights, loud noises, or activity.  General sensitivity to bright lights, loud noises, or smells. Before you get a migraine, you may get warning signs that a migraine is coming (aura). An aura may include:  Seeing flashing lights.  Seeing bright spots, halos, or zigzag lines.  Having tunnel vision or blurred vision.  Having feelings of numbness or tingling.  Having trouble talking.  Having muscle weakness. DIAGNOSIS  A migraine headache is often diagnosed based on:  Symptoms.  Physical exam.  A CT scan or MRI of your head. These imaging tests cannot diagnose migraines, but they can help rule out other causes of headaches. TREATMENT Medicines may be given for pain and nausea. Medicines can also be given to help prevent recurrent migraines.  HOME CARE INSTRUCTIONS  Only take over-the-counter or prescription medicines for pain or discomfort as directed by your health care provider. The use of long-term narcotics is not recommended.  Lie down in a dark, quiet room when you have a migraine.  Keep a journal to find out what may trigger your migraine headaches. For example, write down:  What you eat and drink.  How much sleep you get.  Any change to your diet or medicines.  Limit alcohol consumption.  Quit smoking if you smoke.  Get 7-9 hours of sleep, or as recommended by your health care provider.  Limit stress.  Keep lights dim if bright lights bother you and make your migraines worse. SEEK IMMEDIATE MEDICAL CARE IF:   Your migraine becomes severe.  You have a fever.  You have a stiff neck.  You have vision loss.  You have muscular weakness or loss of muscle control.  You start losing your balance or have trouble walking.  You feel faint or pass out.  You have severe symptoms that are different from your first symptoms. MAKE SURE YOU:   Understand these instructions.  Will watch your condition.  Will get help right away if you are  not doing well or get worse.   This information is not intended to replace advice given to you by your health care provider. Make sure you discuss any questions you have with your health care provider.   Document Released: 07/30/2005 Document Revised: 08/20/2014 Document Reviewed: 04/06/2013 Elsevier Interactive Patient Education [employer]

## 2015-10-08 NOTE — ED Notes (Signed)
Pt here via EMS from home states increasing migraines over the past few months. Tearful in triage, states she is having pain in her chest from anxiety, she said it just feels tight. Was seen here a months ago for migraine as well.

## 2015-10-08 NOTE — ED Notes (Signed)
Sensitive to light, blurred vision left eye per pt

## 2015-10-08 NOTE — ED Provider Notes (Signed)
[facility] Emergency Department Provider Note   ____________________________________________  Time seen: Approximately 8-15am I have reviewed the triage vital signs and the triage nursing note.  HISTORY  Chief Complaint Migraine; Chest Pain; and Anxiety   Historian Patient  HPI Olivia Hanson is a 25 y.o. female with history of prior "migraine" headaches that typically affect left frontal and behind the eye and ear.  She reports she's had increased frequency of migraines over the past couple months. She does not have a primary care physician. She does not have a neurologist or has never seen a neurologist. She states that she is sensitive to light. This headache started today and is moderate to severe. She doesn't know if she is chemically sensitive, but she does use a lot of "plugging" with fragrance. She does have a history of anxiety but does not report any particular new focal stressors. She's had some chest palpitations    Past Medical History  Diagnosis Date  . Depression   . Anxiety   . Asthma     There are no active problems to display for this patient.   Past Surgical History  Procedure Laterality Date  . Ectopic pregnancy surgery      Current Outpatient Rx  Name  Route  Sig  Dispense  Refill  . albuterol (PROVENTIL HFA;VENTOLIN HFA) 108 (90 Base) MCG/ACT inhaler   Inhalation   Inhale 2 puffs into the lungs every 6 (six) hours as needed for wheezing or shortness of breath.         . clonazePAM (KLONOPIN) 1 MG tablet   Oral   Take 1 mg by mouth daily as needed. For anxiety.         Marland Kitchen Spacer/Aero-Holding Chambers (OPTICHAMBER ADVANTAGE) MISC   Other   1 each by Other route once. Always uses her when you're using a metered-dose inhaler. You've aromatase medicine as much, he won't have his much side effect, but you it twice as much medicine and your lungs.   1 each   0   . cyclobenzaprine (FLEXERIL) 10 MG tablet   Oral    Take 1 tablet (10 mg total) by mouth 3 (three) times daily as needed for muscle spasms.   15 tablet   0   . HYDROcodone-acetaminophen (NORCO/VICODIN) 5-325 MG per tablet   Oral   Take 2 tablets by mouth every 4 (four) hours as needed.   6 tablet   0   . ibuprofen (ADVIL,MOTRIN) 800 MG tablet   Oral   Take 1 tablet (800 mg total) by mouth every 8 (eight) hours as needed.   30 tablet   0   . LORazepam (ATIVAN) 1 MG tablet   Oral   Take 1 tablet (1 mg total) by mouth 2 (two) times daily as needed for anxiety.   20 tablet   0   . meloxicam (MOBIC) 15 MG tablet   Oral   Take 1 tablet (15 mg total) by mouth daily.   30 tablet   0   . ondansetron (ZOFRAN ODT) 4 MG disintegrating tablet   Oral   Take 1 tablet (4 mg total) by mouth every 8 (eight) hours as needed for nausea.   10 tablet   0   . predniSONE (DELTASONE) 20 MG tablet      Take 2 tablets the first day, then take one tablet a day for 4 more days.   6 tablet   0     Allergies Review  of patient's allergies indicates no known allergies.  No family history on file.  Social History Social History  Substance Use Topics  . Smoking status: Former Smoker -- 0.00 packs/day  . Smokeless tobacco: None  . Alcohol Use: No    Review of Systems  Constitutional: Negative for fever. Eyes: Negative for visual changes. ENT: Negative for sore throat. Cardiovascular: Negative for chest pain. Respiratory: Negative for shortness of breath. Gastrointestinal: Negative for abdominal pain, vomiting and diarrhea. Genitourinary: Negative for dysuria. Musculoskeletal: Negative for back pain. Skin: Negative for rash. Neurological: Positive for headache. 10 point Review of Systems otherwise negative ____________________________________________   PHYSICAL EXAM:  VITAL SIGNS: ED Triage Vitals  Enc Vitals Group     BP 10/08/15 0743 123/94 mmHg     Pulse Rate 10/08/15 0743 101     Resp 10/08/15 0743 18     Temp 10/08/15  0743 98.5 F (36.9 C)     Temp Source 10/08/15 0743 Oral     SpO2 10/08/15 0743 100 %     Weight 10/08/15 0741 125 lb (56.7 kg)     Height 10/08/15 0741 5\' 5"  (1.651 m)     Head Cir --      Peak Flow --      Pain Score 10/08/15 0741 10     Pain Loc --      Pain Edu? --      Excl. in GC? --      Constitutional: Alert and oriented. Lites out and holding her hand over her eyes due to light sensitivity. HEENT   Head: Normocephalic and atraumatic.      Eyes: Conjunctivae are normal. PERRL. Normal extraocular movements.      Ears:         Nose: No congestion/rhinnorhea.   Mouth/Throat: Mucous membranes are moist.   Neck: No stridor. Cardiovascular/Chest: Normal rate, regular rhythm.  No murmurs, rubs, or gallops. Respiratory: Normal respiratory effort without tachypnea nor retractions. Breath sounds are clear and equal bilaterally. No wheezes/rales/rhonchi. Gastrointestinal: Soft. No distention, no guarding, no rebound. Nontender.    Genitourinary/rectal:Deferred Musculoskeletal: Nontender with normal range of motion in all extremities. No joint effusions.  No lower extremity tenderness.  No edema. Neurologic:  Normal speech and language. No gross or focal neurologic deficits are appreciated. Skin:  Skin is warm, dry and intact. No rash noted. Psychiatric: Mood and affect are normal. Speech and behavior are normal. Patient exhibits appropriate insight and judgment.  ____________________________________________   EKG I, Governor Rooks, MD, the attending physician have personally viewed and interpreted all ECGs.  90 bpm. Normal sinus rhythm. Narrow QRS. Normal axis. Normal ST and T-wave ____________________________________________  LABS (pertinent positives/negatives)  None  ____________________________________________  RADIOLOGY All Xrays were viewed by me. Imaging interpreted by  Radiologist.  None __________________________________________  PROCEDURES  Procedure(s) performed: None  Critical Care performed: None  ____________________________________________   ED COURSE / ASSESSMENT AND PLAN  Pertinent labs & imaging results that were available during my care of the patient were reviewed by me and considered in my medical decision making (see chart for details).   Patient is here with a nonspecific headache that is similar in character to prior headaches that she considers "migraines."  Neurologic exam intact. Patient treated symptomatically with migraine cocktail and IV fluids.  Patient much improved at 11 on repeat check and able for outpatient management and follow-up.  Intact neurologic exam, I am not suspicious of an intracranial mass or emergency. She has not had brain  imaging, and I did discuss with her that I don't feel there is an indication for head CT today, but would recommend she follow-up with a primary care physician and/or neurologist. MRI of the brain may be warranted in the future for frequent headaches given that she has not had neuro imaging yet.  CONSULTATIONS:   None   Patient / Family / Caregiver informed of clinical course, medical decision-making process, and agree with plan.   I discussed return precautions, follow-up instructions, and discharged instructions with patient and/or family.   ___________________________________________   FINAL CLINICAL IMPRESSION(S) / ED DIAGNOSES   Final diagnoses:  Headache, unspecified headache type              Note: This dictation was prepared with Dragon dictation. Any transcriptional errors that result from this process are unintentional   Governor Rooks, MD 10/08/15 1125

## 2016-01-25 ENCOUNTER — Encounter: Payer: Self-pay | Admitting: *Deleted

## 2016-01-25 ENCOUNTER — Emergency Department
Admission: EM | Admit: 2016-01-25 | Discharge: 2016-01-25 | Disposition: A | Discharge status: Home / Self Care | Payer: Self-pay | Attending: Student | Admitting: Student

## 2016-01-25 ENCOUNTER — Emergency Department: Payer: Self-pay

## 2016-01-25 DIAGNOSIS — F329 Major depressive disorder, single episode, unspecified: Secondary | ICD-10-CM | POA: Insufficient documentation

## 2016-01-25 DIAGNOSIS — R55 Syncope and collapse: Secondary | ICD-10-CM | POA: Insufficient documentation

## 2016-01-25 DIAGNOSIS — G43809 Other migraine, not intractable, without status migrainosus: Secondary | ICD-10-CM | POA: Insufficient documentation

## 2016-01-25 DIAGNOSIS — Z87891 Personal history of nicotine dependence: Secondary | ICD-10-CM | POA: Insufficient documentation

## 2016-01-25 DIAGNOSIS — J45909 Unspecified asthma, uncomplicated: Secondary | ICD-10-CM | POA: Insufficient documentation

## 2016-01-25 DIAGNOSIS — Z79899 Other long term (current) drug therapy: Secondary | ICD-10-CM | POA: Insufficient documentation

## 2016-01-25 DIAGNOSIS — Z7951 Long term (current) use of inhaled steroids: Secondary | ICD-10-CM | POA: Insufficient documentation

## 2016-01-25 LAB — URINALYSIS COMPLETE WITH MICROSCOPIC (ARMC ONLY)
BILIRUBIN URINE: NEGATIVE
Bacteria, UA: NONE SEEN
Glucose, UA: NEGATIVE mg/dL
Hgb urine dipstick: NEGATIVE
Ketones, ur: NEGATIVE mg/dL
Leukocytes, UA: NEGATIVE
Nitrite: NEGATIVE
PH: 5 (ref 5.0–8.0)
PROTEIN: NEGATIVE mg/dL
Specific Gravity, Urine: 1.005 (ref 1.005–1.030)

## 2016-01-25 LAB — COMPREHENSIVE METABOLIC PANEL
ALK PHOS: 68 U/L (ref 38–126)
ALT: 17 U/L (ref 14–54)
AST: 20 U/L (ref 15–41)
Albumin: 3.4 g/dL — ABNORMAL LOW (ref 3.5–5.0)
Anion gap: 8 (ref 5–15)
BILIRUBIN TOTAL: 0.3 mg/dL (ref 0.3–1.2)
BUN: 15 mg/dL (ref 6–20)
CO2: 25 mmol/L (ref 22–32)
CREATININE: 0.92 mg/dL (ref 0.44–1.00)
Calcium: 9 mg/dL (ref 8.9–10.3)
Chloride: 102 mmol/L (ref 101–111)
GFR calc Af Amer: >60 mL/min (ref >60)
Glucose, Bld: 92 mg/dL (ref 65–99)
Potassium: 3.5 mmol/L (ref 3.5–5.1)
Sodium: 135 mmol/L (ref 135–145)
Total Protein: 7.7 g/dL (ref 6.5–8.1)

## 2016-01-25 LAB — CBC WITH DIFFERENTIAL/PLATELET
BASOS ABS: 0.1 10*3/uL (ref 0–0.1)
Basophils Relative: 1 %
Eosinophils Absolute: 0.2 10*3/uL (ref 0–0.7)
Eosinophils Relative: 2 %
HEMATOCRIT: 37.2 % (ref 35.0–47.0)
Hemoglobin: 12.2 g/dL (ref 12.0–16.0)
Lymphs Abs: 1.4 10*3/uL (ref 1.0–3.6)
MCH: 28.9 pg (ref 26.0–34.0)
MCHC: 32.9 g/dL (ref 32.0–36.0)
MCV: 88 fL (ref 80.0–100.0)
MONO ABS: 1 10*3/uL — AB (ref 0.2–0.9)
Monocytes Relative: 8 %
NEUTROS ABS: 10.4 10*3/uL — AB (ref 1.4–6.5)
Neutrophils Relative %: 78 %
Platelets: 350 10*3/uL (ref 150–440)
RBC: 4.23 MIL/uL (ref 3.80–5.20)
RDW: 13.9 % (ref 11.5–14.5)
WBC: 13.1 10*3/uL — ABNORMAL HIGH (ref 3.6–11.0)

## 2016-01-25 LAB — POCT PREGNANCY, URINE: PREG TEST UR: NEGATIVE

## 2016-01-25 LAB — TROPONIN I

## 2016-01-25 MED ORDER — KETOROLAC TROMETHAMINE 30 MG/ML IJ SOLN
15.0000 mg | Freq: Once | INTRAMUSCULAR | Status: AC
  Administered 2016-01-25: 15 mg via INTRAVENOUS

## 2016-01-25 MED ORDER — DIPHENHYDRAMINE HCL 50 MG/ML IJ SOLN
12.5000 mg | Freq: Once | INTRAMUSCULAR | Status: AC
  Administered 2016-01-25: 12.5 mg via INTRAVENOUS
  Filled 2016-01-25: qty 1

## 2016-01-25 MED ORDER — ONDANSETRON HCL 4 MG/2ML IJ SOLN
INTRAMUSCULAR | Status: AC
  Administered 2016-01-25: 4 mg via INTRAVENOUS
  Filled 2016-01-25: qty 2

## 2016-01-25 MED ORDER — SODIUM CHLORIDE 0.9 % IV BOLUS (SEPSIS)
1000.0000 mL | Freq: Once | INTRAVENOUS | Status: AC
  Administered 2016-01-25: 1000 mL via INTRAVENOUS

## 2016-01-25 MED ORDER — KETOROLAC TROMETHAMINE 60 MG/2ML IM SOLN
15.0000 mg | Freq: Once | INTRAMUSCULAR | Status: DC
  Filled 2016-01-25: qty 2

## 2016-01-25 MED ORDER — METOCLOPRAMIDE HCL 5 MG/ML IJ SOLN
10.0000 mg | Freq: Once | INTRAMUSCULAR | Status: AC
  Administered 2016-01-25: 10 mg via INTRAVENOUS
  Filled 2016-01-25: qty 2

## 2016-01-25 MED ORDER — ONDANSETRON HCL 4 MG/2ML IJ SOLN
4.0000 mg | Freq: Once | INTRAMUSCULAR | Status: AC
  Administered 2016-01-25: 4 mg via INTRAVENOUS

## 2016-01-25 NOTE — ED Notes (Signed)
Pt to triage via wheelchair.  Pt reports she was standing and had a syncopal episode today.  Pt reports a headache and high blood pressure.  Pt takes no blood pressure meds.  Pt alert. Speech clear.  Pt states reports syncopal episode last month.

## 2016-01-25 NOTE — ED Notes (Signed)
ekg done and pt taken to [address].

## 2016-01-25 NOTE — ED Provider Notes (Addendum)
[facility] Emergency Department Provider Note   ____________________________________________  Time seen: Approximately 10:12 PM  I have reviewed the triage vital signs and the nursing notes.   HISTORY  Chief Complaint Loss of Consciousness and Dizziness    HPI Olivia Hanson is a 25 y.o. female history of migraine headaches, depression, anxiety who presents for evaluation of a syncopal episode today, gradual onset, now resolved, no modifying factors. The patient reports that she has had mild headache today which has worsened throughout the day and feels similar to her usual migraine headaches. The patient reports that she was standing in a family member's home when she started "feeling like I was going to pass out". She felt quite lightheaded, was attempting to grab stomach to support herself but fainted and fell, she is unsure whether not she hit her head. No chest pain difficulty breathing. No vomiting, diarrhea, fevers, chills, neck pain or neck stiffness. She is complaining of her typical migraine headache at this time. No family history of early coronary artery disease or sudden cardiac death. She denies any history of DVT or PE. She does have photophobia which is typical of her migraines.   Past Medical History  Diagnosis Date  . Depression   . Anxiety   . Asthma     There are no active problems to display for this patient.   Past Surgical History  Procedure Laterality Date  . Ectopic pregnancy surgery      Current Outpatient Rx  Name  Route  Sig  Dispense  Refill  . albuterol (PROVENTIL HFA;VENTOLIN HFA) 108 (90 Base) MCG/ACT inhaler   Inhalation   Inhale 2 puffs into the lungs every 6 (six) hours as needed for wheezing or shortness of breath.         . clonazePAM (KLONOPIN) 1 MG tablet   Oral   Take 1 mg by mouth daily as needed. For anxiety.         . cyclobenzaprine (FLEXERIL) 10 MG tablet   Oral   Take 1 tablet (10 mg  total) by mouth 3 (three) times daily as needed for muscle spasms.   15 tablet   0   . HYDROcodone-acetaminophen (NORCO/VICODIN) 5-325 MG per tablet   Oral   Take 2 tablets by mouth every 4 (four) hours as needed.   6 tablet   0   . ibuprofen (ADVIL,MOTRIN) 800 MG tablet   Oral   Take 1 tablet (800 mg total) by mouth every 8 (eight) hours as needed.   30 tablet   0   . LORazepam (ATIVAN) 1 MG tablet   Oral   Take 1 tablet (1 mg total) by mouth 2 (two) times daily as needed for anxiety.   20 tablet   0   . meloxicam (MOBIC) 15 MG tablet   Oral   Take 1 tablet (15 mg total) by mouth daily.   30 tablet   0   . ondansetron (ZOFRAN ODT) 4 MG disintegrating tablet   Oral   Take 1 tablet (4 mg total) by mouth every 8 (eight) hours as needed for nausea.   10 tablet   0   . predniSONE (DELTASONE) 20 MG tablet      Take 2 tablets the first day, then take one tablet a day for 4 more days.   6 tablet   0   . Spacer/Aero-Holding Chambers (OPTICHAMBER ADVANTAGE) MISC   Other   1 each by Other route once. [facility]  uses her when you're using a metered-dose inhaler. You've aromatase medicine as much, he won't have his much side effect, but you it twice as much medicine and your lungs.   1 each   0     Allergies Review of patient's allergies indicates no known allergies.  No family history on file.  Social History Social History  Substance Use Topics  . Smoking status: Former Smoker -- 0.00 packs/day  . Smokeless tobacco: None  . Alcohol Use: No    Review of Systems Constitutional: No fever/chills Eyes: No visual changes. ENT: No sore throat. Cardiovascular: Denies chest pain. Respiratory: Denies shortness of breath. Gastrointestinal: No abdominal pain.  No nausea, no vomiting.  No diarrhea.  No constipation. Genitourinary: Negative for dysuria. Musculoskeletal: Negative for back pain. Skin: Negative for rash. Neurological: Positive for headache, no focal weakness  or numbness.  10-point ROS otherwise negative.  ____________________________________________   PHYSICAL EXAM:  Filed Vitals:   01/25/16 1853 01/25/16 1936 01/25/16 2248  BP: 154/111 165/102 132/71  Pulse: 11 107 96  Temp: 98.6 F (37 C) 98.5 F (36.9 C)   TempSrc: Oral Oral   Resp: 20 20 20   Height: 5\' 5"  (1.651 m)    Weight: 140 lb (63.504 kg)    SpO2: 100% 100% 99%    VITAL SIGNS: ED Triage Vitals  Enc Vitals Group     BP 01/25/16 1853 154/111 mmHg     Pulse Rate 01/25/16 1853 11     Resp 01/25/16 1853 20     Temp 01/25/16 1853 98.6 F (37 C)     Temp Source 01/25/16 1853 Oral     SpO2 01/25/16 1853 100 %     Weight 01/25/16 1853 140 lb (63.504 kg)     Height 01/25/16 1853 5\' 5"  (1.651 m)     Head Cir --      Peak Flow --      Pain Score 01/25/16 1855 7     Pain Loc --      Pain Edu? --      Excl. in GC? --     Constitutional: Alert and oriented. Nontoxic-appearing and in no acute distress. Eyes: Conjunctivae are normal. PERRL. EOMI. +photophobia Head: Atraumatic. Nose: No congestion/rhinnorhea. Mouth/Throat: Mucous membranes are moist.  Oropharynx non-erythematous. Neck: No stridor.  Supple without meningismus. Cardiovascular: mildly tachycardic rate, regular rhythm. Grossly normal heart sounds.  Good peripheral circulation. Respiratory: Normal respiratory effort.  No retractions. Lungs CTAB. Gastrointestinal: Soft and nontender. No distention. No CVA tenderness. Genitourinary: deferred Musculoskeletal: No lower extremity tenderness nor edema.  No joint effusions. Neurologic:  Normal speech and language. No gross focal neurologic deficits are appreciated. No gait instability. 5 out of 5 strength bilateral upper and lower extremities, sensation intact to light touch throughout, cranial nerves II through XII intact. Skin:  Skin is warm, dry and intact. No rash noted. Psychiatric: Mood and affect are normal. Speech and behavior are  normal.  ____________________________________________   LABS (all labs ordered are listed, but only abnormal results are displayed)  Labs Reviewed  CBC WITH DIFFERENTIAL/PLATELET - Abnormal; Notable for the following:    WBC 13.1 (*)    Neutro Abs 10.4 (*)    Monocytes Absolute 1.0 (*)    All other components within normal limits  COMPREHENSIVE METABOLIC PANEL - Abnormal; Notable for the following:    Albumin 3.4 (*)    All other components within normal limits  URINALYSIS COMPLETEWITH MICROSCOPIC ([facility] ONLY) - Abnormal; Notable for  the following:    Color, Urine STRAW (*)    APPearance CLEAR (*)    Squamous Epithelial / LPF 0-5 (*)    All other components within normal limits  TROPONIN I  POC URINE PREG, ED  POCT PREGNANCY, URINE   ____________________________________________  EKG  ED ECG REPORT I, Gayla Doss, the attending physician, personally viewed and interpreted this ECG.   Date: 01/25/2016  EKG Time: 18:59  Rate: 102  Rhythm: sinus tachycardia  Axis: normal  Intervals:none  ST&T Change: No acute ST elevation or ST depression. No STEMI, no Brugada, normal QTC. ____________________________________________  RADIOLOGY  CT head IMPRESSION: 1. Negative for bleed or other acute intracranial process.  CXR IMPRESSION: No acute cardiopulmonary disease.   ____________________________________________   PROCEDURES  Procedure(s) performed: None  Critical Care performed: No  ____________________________________________   INITIAL IMPRESSION / ASSESSMENT AND PLAN / ED COURSE  Pertinent labs & imaging results that were available during my care of the patient were reviewed by me and considered in my medical decision making (see chart for details).  Olivia Hanson is a 25 y.o. female history of migraine headaches, depression, anxiety who presents for evaluation of a syncopal episode today. She is also having her typical migraine headache. On exam,  she does have photophobia but is otherwise in no acute distress. Neck supple without meningismus and I doubt meningitis. I'll signs are notable for mild tachycardia which I suspect his pain related, we'll reassess. The remainder of her vital signs are stable and she is afebrile. She has an intact neurological examination. I suspect she had an episode of vasovagal syncope today given significant prodrome. EKG is reassuring, doubt cardiogenic causes syncope. Reassuring neurological exam makes neurogenic cause of syncope less likely however given the question of fall with possible head injury, we'll obtain screening labs, CT head, chest x-ray, treat with migraine cocktail and reassess for disposition.  ----------------------------------------- 11:18 PM on 01/25/2016 -----------------------------------------  Patient reports complete resolution of headache after Reglan, Toradol, Benadryl and fluids. She is requesting discharge. Her tachycardia has resolved. I reviewed her labs, CBC is notable for mild leukocytosis, unremarkable CMP and negative troponin. Urinalysis is not consistent with infection. Negative pregnancy test. CT head shows no acute intracranial process. Chest x-ray shows no acute cardiopulmonary disease. We discussed return precautions, need for close PCP follow-up and she is comfortable with the discharge plan. DC home. ____________________________________________   FINAL CLINICAL IMPRESSION(S) / ED DIAGNOSES  Final diagnoses:  Syncope, unspecified syncope type  Other migraine without status migrainosus, not intractable      NEW MEDICATIONS STARTED DURING THIS VISIT:  New Prescriptions   No medications on file     Note:  This document was prepared using Dragon voice recognition software and may include unintentional dictation errors.    Gayla Doss, MD 01/25/16 1610  Gayla Doss, MD 01/25/16 2322

## 2016-03-12 ENCOUNTER — Emergency Department
Admission: EM | Admit: 2016-03-12 | Discharge: 2016-03-12 | Disposition: A | Discharge status: Home / Self Care | Payer: Medicaid Other | Attending: Emergency Medicine | Admitting: Emergency Medicine

## 2016-03-12 DIAGNOSIS — Y929 Unspecified place or not applicable: Secondary | ICD-10-CM | POA: Insufficient documentation

## 2016-03-12 DIAGNOSIS — Y939 Activity, unspecified: Secondary | ICD-10-CM | POA: Insufficient documentation

## 2016-03-12 DIAGNOSIS — R51 Headache: Secondary | ICD-10-CM | POA: Insufficient documentation

## 2016-03-12 DIAGNOSIS — W19XXXA Unspecified fall, initial encounter: Secondary | ICD-10-CM | POA: Insufficient documentation

## 2016-03-12 DIAGNOSIS — Z87891 Personal history of nicotine dependence: Secondary | ICD-10-CM | POA: Insufficient documentation

## 2016-03-12 DIAGNOSIS — Y999 Unspecified external cause status: Secondary | ICD-10-CM | POA: Insufficient documentation

## 2016-03-12 DIAGNOSIS — J45909 Unspecified asthma, uncomplicated: Secondary | ICD-10-CM | POA: Insufficient documentation

## 2016-03-12 DIAGNOSIS — R55 Syncope and collapse: Secondary | ICD-10-CM

## 2016-03-12 DIAGNOSIS — R519 Headache, unspecified: Secondary | ICD-10-CM

## 2016-03-12 DIAGNOSIS — Z79899 Other long term (current) drug therapy: Secondary | ICD-10-CM | POA: Insufficient documentation

## 2016-03-12 HISTORY — DX: Syncope and collapse: R55

## 2016-03-12 LAB — BASIC METABOLIC PANEL
ANION GAP: 3 — AB (ref 5–15)
BUN: 15 mg/dL (ref 6–20)
CHLORIDE: 109 mmol/L (ref 101–111)
CO2: 25 mmol/L (ref 22–32)
Calcium: 8.5 mg/dL — ABNORMAL LOW (ref 8.9–10.3)
Creatinine, Ser: 0.95 mg/dL (ref 0.44–1.00)
GFR calc non Af Amer: >60 mL/min (ref >60)
Glucose, Bld: 68 mg/dL (ref 65–99)
POTASSIUM: 3.7 mmol/L (ref 3.5–5.1)
Sodium: 137 mmol/L (ref 135–145)

## 2016-03-12 LAB — URINALYSIS COMPLETE WITH MICROSCOPIC (ARMC ONLY)
Bacteria, UA: NONE SEEN
Bilirubin Urine: NEGATIVE
Glucose, UA: NEGATIVE mg/dL
Hgb urine dipstick: NEGATIVE
Ketones, ur: NEGATIVE mg/dL
LEUKOCYTES UA: NEGATIVE
Nitrite: NEGATIVE
PH: 6 (ref 5.0–8.0)
PROTEIN: NEGATIVE mg/dL
RBC / HPF: NONE SEEN RBC/hpf (ref 0–5)
SQUAMOUS EPITHELIAL / LPF: NONE SEEN
Specific Gravity, Urine: 1 — ABNORMAL LOW (ref 1.005–1.030)
WBC UA: NONE SEEN WBC/hpf (ref 0–5)

## 2016-03-12 LAB — CBC
HEMATOCRIT: 36.6 % (ref 35.0–47.0)
HEMOGLOBIN: 12.7 g/dL (ref 12.0–16.0)
MCH: 30.6 pg (ref 26.0–34.0)
MCHC: 34.7 g/dL (ref 32.0–36.0)
MCV: 88.2 fL (ref 80.0–100.0)
Platelets: 227 10*3/uL (ref 150–440)
RBC: 4.15 MIL/uL (ref 3.80–5.20)
RDW: 14.9 % — ABNORMAL HIGH (ref 11.5–14.5)
WBC: 7.7 10*3/uL (ref 3.6–11.0)

## 2016-03-12 LAB — GLUCOSE, CAPILLARY: GLUCOSE-CAPILLARY: 68 mg/dL (ref 65–99)

## 2016-03-12 LAB — PREGNANCY, URINE: Preg Test, Ur: NEGATIVE

## 2016-03-12 MED ORDER — METOCLOPRAMIDE HCL 5 MG/ML IJ SOLN
10.0000 mg | Freq: Once | INTRAMUSCULAR | Status: AC
  Administered 2016-03-12: 10 mg via INTRAVENOUS
  Filled 2016-03-12: qty 2

## 2016-03-12 MED ORDER — SODIUM CHLORIDE 0.9 % IV SOLN
Freq: Once | INTRAVENOUS | Status: AC
  Administered 2016-03-12: 18:00:00 via INTRAVENOUS

## 2016-03-12 MED ORDER — KETOROLAC TROMETHAMINE 30 MG/ML IJ SOLN
30.0000 mg | Freq: Once | INTRAMUSCULAR | Status: AC
  Administered 2016-03-12: 30 mg via INTRAVENOUS
  Filled 2016-03-12: qty 1

## 2016-03-12 MED ORDER — DIPHENHYDRAMINE HCL 50 MG/ML IJ SOLN
25.0000 mg | Freq: Once | INTRAMUSCULAR | Status: AC
  Administered 2016-03-12: 25 mg via INTRAVENOUS
  Filled 2016-03-12: qty 1

## 2016-03-12 NOTE — ED Notes (Signed)
CBG 68. Pt unable to give urine sample; given labeled cup.

## 2016-03-12 NOTE — ED Triage Notes (Signed)
Pt arrives to ER via POV after syncopal episode while outside X 2 hours ago. Pt reports complete LOC, witnessed fall; denies hitting her head or any injuries. Pt now c/o headache and sensitivity to light. Hx of syncope X 2 months ago.

## 2016-03-12 NOTE — ED Notes (Signed)
Pt saY she wa outside eating and passed out.  This has happened before.  Pt is holding head says one side hurts.

## 2016-03-12 NOTE — ED Provider Notes (Signed)
[facility] Emergency Department Provider Note        Time seen: ----------------------------------------- 4:49 PM on 03/12/2016 -----------------------------------------    I have reviewed the triage vital signs and the nursing notes.   HISTORY  Chief Complaint Loss of Consciousness    HPI Olivia Hanson is a 25 y.o. female who arrives to the ER after a syncopal episode while outside 2 hours ago. She reports complete loss of consciousness with unwitnessed fall. She denies hitting her head or any injuries. She's not had a consistently to light. She has syncopal event 2 months ago.Patient reports headache started about 8 months ago. She does not always have syncope with her headaches. Recent CT scanning was normal. She does not have insurance and so she could not follow up with a neurologist.   Past Medical History:  Diagnosis Date  . Anxiety   . Asthma   . Depression   . Syncope     There are no active problems to display for this patient.   Past Surgical History:  Procedure Laterality Date  . ECTOPIC PREGNANCY SURGERY      Allergies Review of patient's allergies indicates no known allergies.  Social History Social History  Substance Use Topics  . Smoking status: Former Smoker    Packs/day: 0.00  . Smokeless tobacco: Never Used  . Alcohol use No    Review of Systems Constitutional: Negative for fever. Cardiovascular: Negative for chest pain. Respiratory: Negative for shortness of breath. Gastrointestinal: Negative for abdominal pain,Positive for nausea Genitourinary: Negative for dysuria. Musculoskeletal: Negative for back pain. Skin: Negative for rash. Neurological: Positive for headache  10-point ROS otherwise negative.  ____________________________________________   PHYSICAL EXAM:  VITAL SIGNS: ED Triage Vitals  Enc Vitals Group     BP 03/12/16 1456 114/65     Pulse Rate 03/12/16 1456 80     Resp 03/12/16 1456  16     Temp 03/12/16 1456 98.2 F (36.8 C)     Temp Source 03/12/16 1456 Oral     SpO2 03/12/16 1456 100 %     Weight 03/12/16 1456 130 lb (59 kg)     Height 03/12/16 1456 5\' 5"  (1.651 m)     Head Circumference --      Peak Flow --      Pain Score 03/12/16 1457 9     Pain Loc --      Pain Edu? --      Excl. in GC? --     Constitutional: Alert and oriented. Well appearing and in no distress. Eyes: Conjunctivae are normal. PERRL. Normal extraocular movements. ENT   Head: Normocephalic and atraumatic.   Nose: No congestion/rhinnorhea.   Mouth/Throat: Mucous membranes are moist.   Neck: No stridor. Cardiovascular: Normal rate, regular rhythm. No murmurs, rubs, or gallops. Respiratory: Normal respiratory effort without tachypnea nor retractions. Breath sounds are clear and equal bilaterally. No wheezes/rales/rhonchi. Gastrointestinal: Soft and nontender. Normal bowel sounds Musculoskeletal: Nontender with normal range of motion in all extremities. No lower extremity tenderness nor edema. Neurologic:  Normal speech and language. No gross focal neurologic deficits are appreciated.  Skin:  Skin is warm, dry and intact. No rash noted. Psychiatric: Mood and affect are normal. Speech and behavior are normal.  ____________________________________________  EKG: Interpreted by me. Sinus rhythm with marked sinus arrhythmia, rate of 73 bpm, normal PR interval, normal QRS, normal QT interval.  ____________________________________________  ED COURSE:  Pertinent labs & imaging results that were available during my  care of the patient were reviewed by me and considered in my medical decision making (see chart for details). Clinical Course  Patient is in no distress, will give IV headache cocktail, check basic labs and reevaluate.  Procedures ____________________________________________   LABS (pertinent positives/negatives)  Labs Reviewed  BASIC METABOLIC PANEL - Abnormal;  Notable for the following:       Result Value   Calcium 8.5 (*)    Anion gap 3 (*)    All other components within normal limits  CBC - Abnormal; Notable for the following:    RDW 14.9 (*)    All other components within normal limits  URINALYSIS COMPLETEWITH MICROSCOPIC ([facility] ONLY) - Abnormal; Notable for the following:    Color, Urine COLORLESS (*)    APPearance CLEAR (*)    Specific Gravity, Urine 1.000 (*)    All other components within normal limits  GLUCOSE, CAPILLARY  PREGNANCY, URINE  CBG MONITORING, ED  POC URINE PREG, ED   ____________________________________________  FINAL ASSESSMENT AND PLAN  Headache  Plan: Patient with labs and imaging as dictated above. Patient currently with improved symptoms. No clear etiology for her syncope. This is happened to her in the past. She has no way of achieving outpatient follow-up. She is advised to return for worsening symptoms.   Emily Filbert, MD   Note: This dictation was prepared with Dragon dictation. Any transcriptional errors that result from this process are unintentional    Emily Filbert, MD 03/12/16 1836

## 2016-07-17 ENCOUNTER — Emergency Department
Admission: EM | Admit: 2016-07-17 | Discharge: 2016-07-17 | Disposition: A | Discharge status: Home / Self Care | Payer: Medicaid Other | Attending: Emergency Medicine | Admitting: Emergency Medicine

## 2016-07-17 ENCOUNTER — Encounter: Payer: Self-pay | Admitting: Emergency Medicine

## 2016-07-17 DIAGNOSIS — Z791 Long term (current) use of non-steroidal anti-inflammatories (NSAID): Secondary | ICD-10-CM | POA: Insufficient documentation

## 2016-07-17 DIAGNOSIS — J45909 Unspecified asthma, uncomplicated: Secondary | ICD-10-CM | POA: Insufficient documentation

## 2016-07-17 DIAGNOSIS — Z79899 Other long term (current) drug therapy: Secondary | ICD-10-CM | POA: Insufficient documentation

## 2016-07-17 DIAGNOSIS — O039 Complete or unspecified spontaneous abortion without complication: Secondary | ICD-10-CM | POA: Insufficient documentation

## 2016-07-17 DIAGNOSIS — Z87891 Personal history of nicotine dependence: Secondary | ICD-10-CM | POA: Insufficient documentation

## 2016-07-17 DIAGNOSIS — N898 Other specified noninflammatory disorders of vagina: Secondary | ICD-10-CM | POA: Insufficient documentation

## 2016-07-17 LAB — HCG, QUANTITATIVE, PREGNANCY

## 2016-07-17 LAB — POCT PREGNANCY, URINE: PREG TEST UR: NEGATIVE

## 2016-07-17 NOTE — ED Provider Notes (Signed)
[facility] Emergency Department Provider Note  Time seen: 3:24 PM  I have reviewed the triage vital signs and the nursing notes.   HISTORY  Chief Complaint Vaginal Bleeding    HPI Olivia Hanson is a 25 y.o. 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 According to the patient approximately 2 weeks ago she took 2 pregnancy tests which were both faintly positive. States her last period had been 06/12/16. She states approximately one week ago she developed vaginal bleeding which lasted 2 or 3 days. States the amount was similar to her normal period but shorter lasting. Patient was concerned that she could've had a miscarriage so she came to the emergency department today for evaluation. Denies any abdominal pain. States the bleeding stopped 3 days ago.  Past Medical History:  Diagnosis Date  . Anxiety   . Asthma   . Depression   . Syncope     There are no active problems to display for this patient.   Past Surgical History:  Procedure Laterality Date  . CESAREAN SECTION    . ECTOPIC PREGNANCY SURGERY      Prior to Admission medications   Medication Sig Start Date End Date Taking? Authorizing Provider  albuterol (PROVENTIL HFA;VENTOLIN HFA) 108 (90 Base) MCG/ACT inhaler Inhale 2 puffs into the lungs every 6 (six) hours as needed for wheezing or shortness of breath.    Historical Provider, MD  clonazePAM (KLONOPIN) 1 MG tablet Take 1 mg by mouth daily as needed. For anxiety.    Historical Provider, MD  cyclobenzaprine (FLEXERIL) 10 MG tablet Take 1 tablet (10 mg total) by mouth 3 (three) times daily as needed for muscle spasms. 08/10/15   Delorise RoyalsJonathan D Cuthriell, PA-C  HYDROcodone-acetaminophen (NORCO/VICODIN) 5-325 MG per tablet Take 2 tablets by mouth every 4 (four) hours as needed. 07/14/13   Jillyn LedgerJessica K Palmer, PA-C  ibuprofen  (ADVIL,MOTRIN) 800 MG tablet Take 1 tablet (800 mg total) by mouth every 8 (eight) hours as needed. 04/06/15   Emily FilbertJonathan E Williams, MD  LORazepam (ATIVAN) 1 MG tablet Take 1 tablet (1 mg total) by mouth 2 (two) times daily as needed for anxiety. 08/14/15 08/13/16  Jennye MoccasinBrian S Quigley, MD  meloxicam (MOBIC) 15 MG tablet Take 1 tablet (15 mg total) by mouth daily. 08/10/15   Delorise RoyalsJonathan D Cuthriell, PA-C  ondansetron (ZOFRAN ODT) 4 MG disintegrating tablet Take 1 tablet (4 mg total) by mouth every 8 (eight) hours as needed for nausea. 07/14/13   Jillyn LedgerJessica K Palmer, PA-C  predniSONE (DELTASONE) 20 MG tablet Take 2 tablets the first day, then take one tablet a day for 4 more days. 07/14/15   Darien Ramusavid W Kaminski, MD  Spacer/Aero-Holding Chambers [facility](OPTICHAMBER ADVANTAGE) MISC 1 each by Other route once. Always uses her when you're using a metered-dose inhaler. You've aromatase medicine as much, he won't have his much side effect, but you it twice as much medicine and your lungs. 07/14/15   Darien Ramusavid W Kaminski, MD    No Known Allergies  No family history on file.  Social History Social History  Substance Use Topics  . Smoking status: Former Smoker    Packs/day: 0.00  . Smokeless tobacco: Never Used  . Alcohol use No    Review of Systems Constitutional: Negative for fever Cardiovascular: Negative for chest pain. Respiratory: Negative for shortness of breath. Gastrointestinal: Negative for abdominal pain Genitourinary: Negative  for dysuria.Positive for vaginal bleeding which stopped 3 days ago. Musculoskeletal: Negative for back pain. Neurological: Negative for headache 10-point ROS otherwise negative.  ____________________________________________   PHYSICAL EXAM:  VITAL SIGNS: ED Triage Vitals  Enc Vitals Group     BP 07/17/16 1317 130/82     Pulse Rate 07/17/16 1317 86     Resp 07/17/16 1317 18     Temp 07/17/16 1317 98.6 F (37 C)     Temp Source 07/17/16 1317 Oral     SpO2 07/17/16 1317 100 %      Weight 07/17/16 1318 140 lb (63.5 kg)     Height 07/17/16 1318 5\' 5"  (1.651 m)     Head Circumference --      Peak Flow --      Pain Score 07/17/16 1318 0     Pain Loc --      Pain Edu? --      Excl. in GC? --     Constitutional: Alert and oriented. Well appearing and in no distress. Eyes: Normal exam ENT   Head: Normocephalic and atraumatic.   Mouth/Throat: Mucous membranes are moist. Cardiovascular: Normal rate, regular rhythm. No murmur Respiratory: Normal respiratory effort without tachypnea nor retractions. Breath sounds are clear  Gastrointestinal: Soft and nontender. No distention.   Musculoskeletal: Nontender with normal range of motion in all extremities. Neurologic:  Normal speech and language. No gross focal neurologic deficits  Skin:  Skin is warm, dry and intact.  Psychiatric: Mood and affect are normal.   ____________________________________________    INITIAL IMPRESSION / ASSESSMENT AND PLAN / ED COURSE  Pertinent labs & imaging results that were available during my care of the patient were reviewed by me and considered in my medical decision making (see chart for details).  The patient presents the emergency department with a positive pregnancy test at home 2 weeks ago followed by several days of vaginal bleeding. Patient states the bleeding stopped 3 days ago. Denies any abdominal pain. Nontender abdominal exam today. We will check a beta hCG quantitative. However the patient did have pregnancy test that were positive at home and now has a negative urine pregnancy test in the emergency department I highly suspect the patient likely had an early miscarriage. Patient agreeable to plan. Overall she appears very well, no concerning findings on examination.  Beta hCG quantitative is negative. Patient has likely experienced a miscarriage. Patient will be discharged home with routine OB/GYN follow-up. Patient agreeable to  plan. ____________________________________________   FINAL CLINICAL IMPRESSION(S) / ED DIAGNOSES  Miscarriage    Minna AntisKevin Ender Rorke, MD 07/17/16 1630

## 2016-07-17 NOTE — ED Triage Notes (Signed)
Patient presents to the ED with 2 days of vaginal bleeding after getting a positive pregnancy test.  Patient reports last period prior to this vaginal bleeding was 06/12/16.  Patient reports history of ectopic pregnancy and she is concerned this could be the same.  Patient denies abdominal pain.
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
## 2017-06-24 IMAGING — CT CT HEAD W/O CM
3 series · 15 of 47 positions shown, 18 images · non-contrast
Comparison: 08/14/2015

CLINICAL DATA: syncopal episode today. Pt reports a headache and
high blood pressure. Pt takes no blood pressure meds. Pt alert.
Speech clear. Pt states reports syncopal episode last month. unable
to removed piercing.

EXAM:
CT HEAD WITHOUT CONTRAST
TECHNIQUE: Contiguous axial images were obtained from the base of the skull
through the vertex without intravenous contrast.

[Series 2: head wo · axial · 0.39mm/px · z∈[-50,+75]mm · 9 of 31 slices shown, 12 images]
[im 3/31  brain]
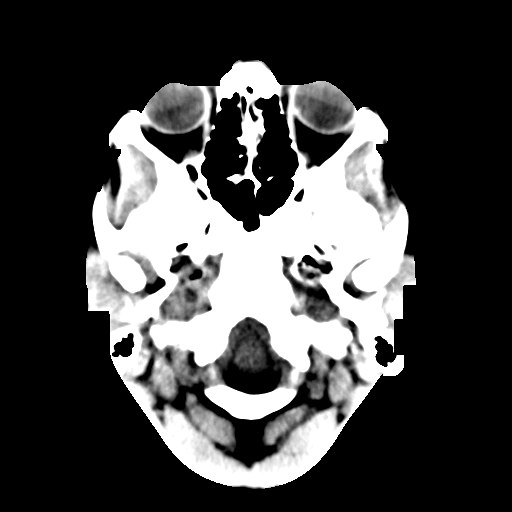
[im 3/31  bone]
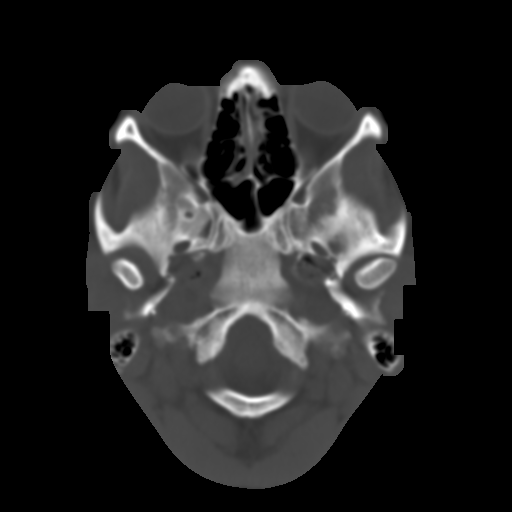
[im 6/31  brain]
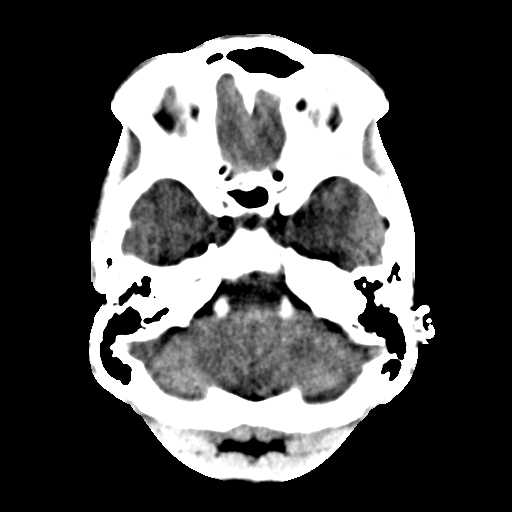
[im 9/31  brain]
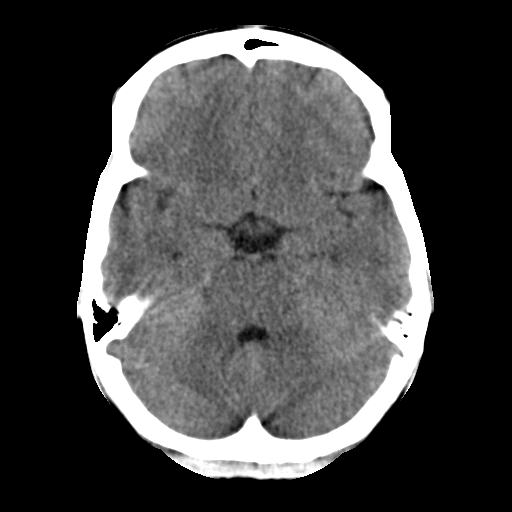
[im 12/31  brain]
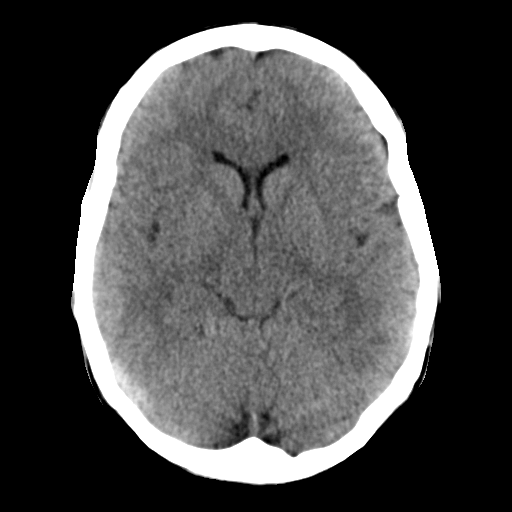
[im 16/31  brain]
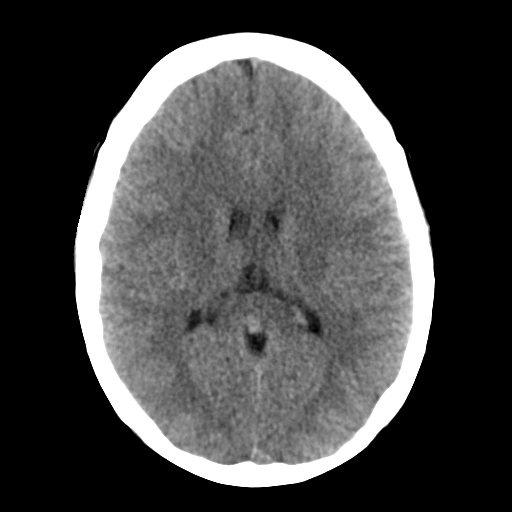
[im 16/31  bone]
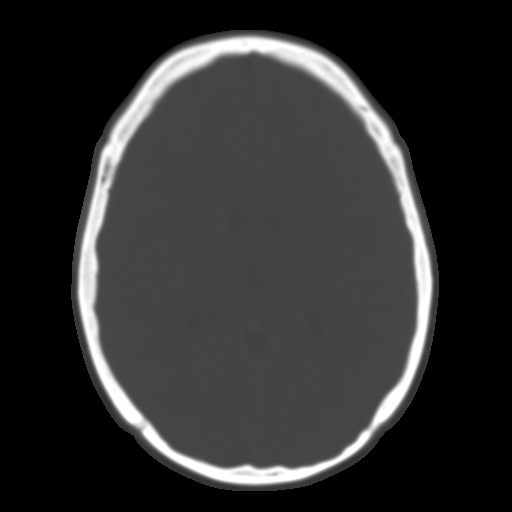
[im 19/31  brain]
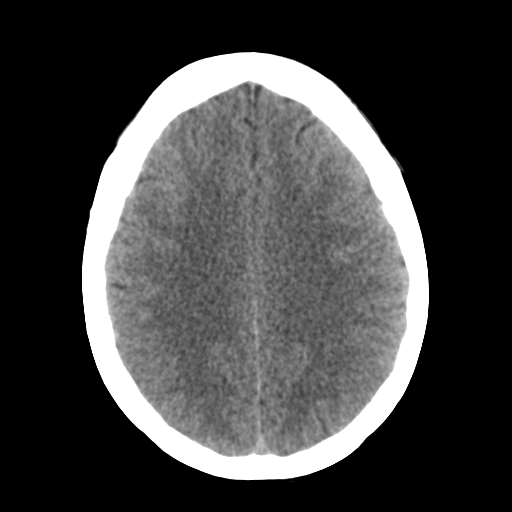
[im 22/31  brain]
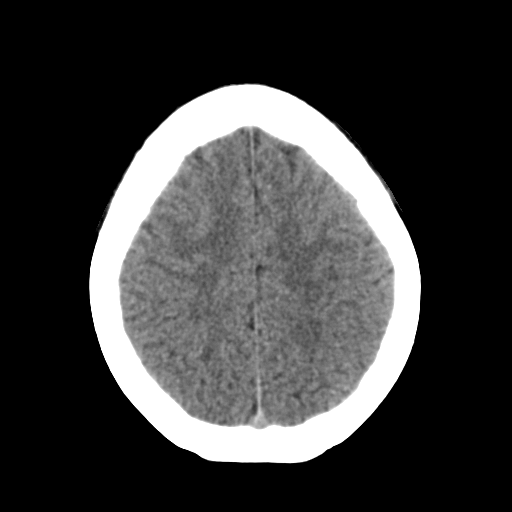
[im 25/31  brain]
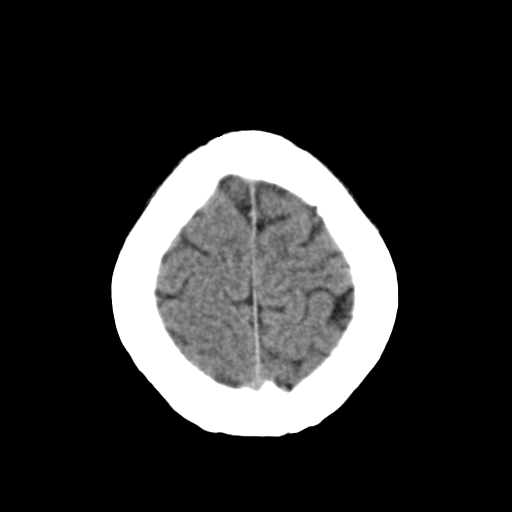
[im 28/31  brain]
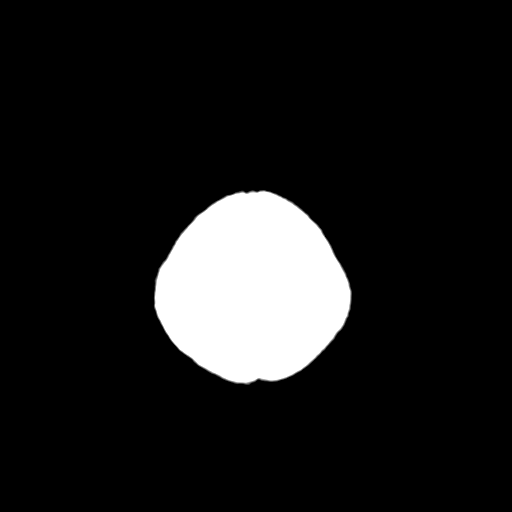
[im 28/31  bone]
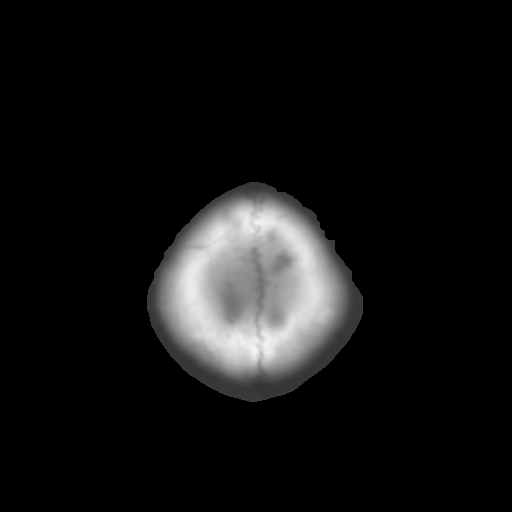

[Series 4: coronal soft tissue · coronal · 0.31mm/px · 3 of 60 slices shown]
[im 20/60  brain]
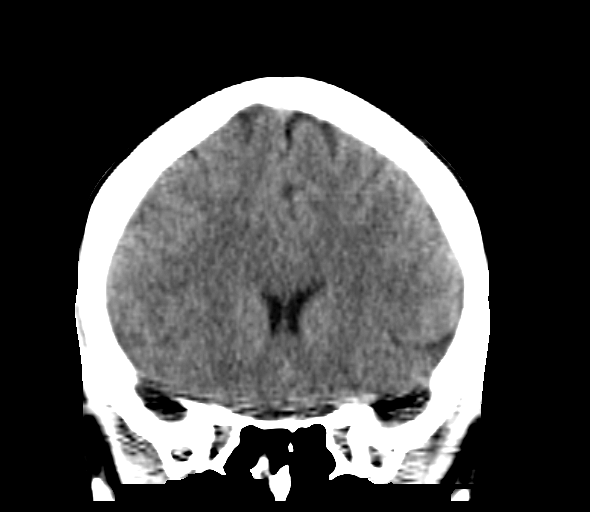
[im 27/60  brain]
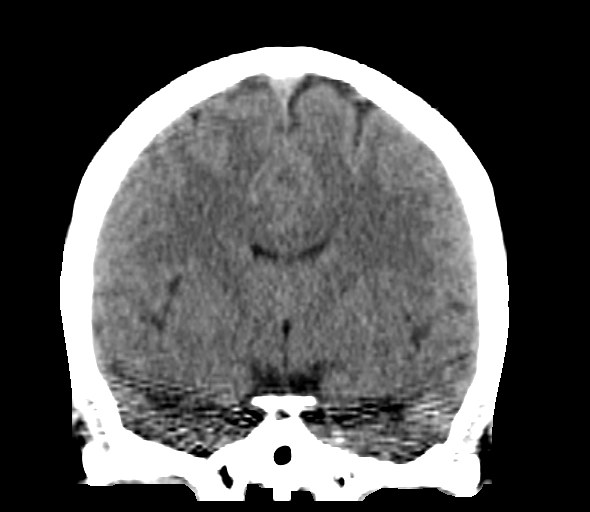
[im 33/60  brain]
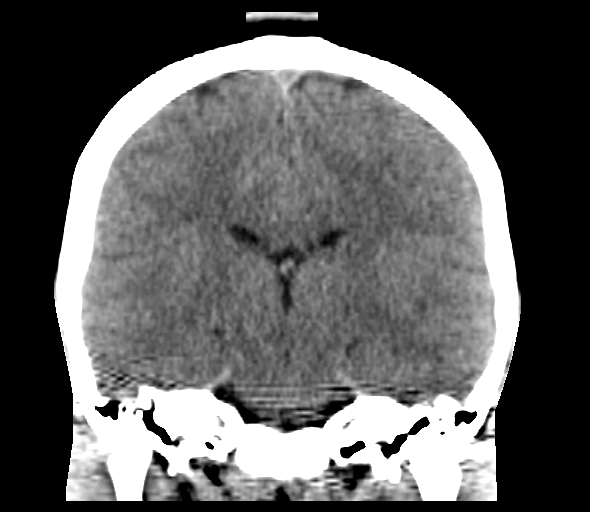

[Series 5: sagittal soft tissue · sagittal · 0.31mm/px · 3 of 49 slices shown]
[im 17/49  brain]
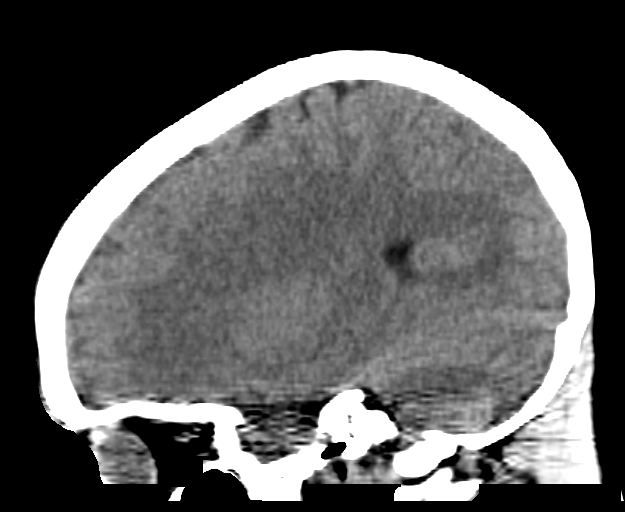
[im 25/49  brain]
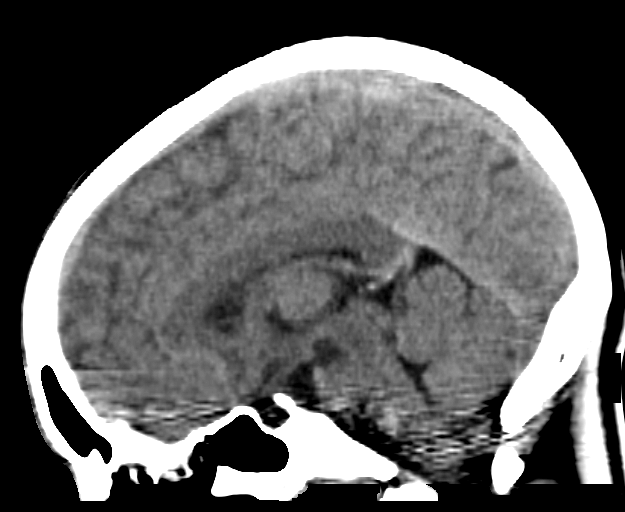
[im 33/49  brain]
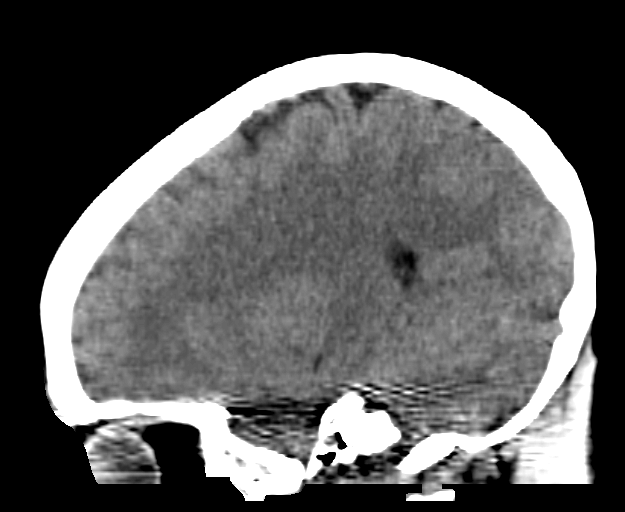

[15 of 47 positions shown; findings below may reference images not displayed]

FINDINGS: Brain: No evidence of acute infarction, hemorrhage, extra-axial
collection, ventriculomegaly, or mass effect.

Vascular: No hyperdense vessel or unexpected calcification.

Skull: Negative for fracture or focal lesion.

Sinuses/Orbits: No acute findings.

Other: None.
IMPRESSION: 1. Negative for bleed or other acute intracranial process.

## 2019-03-03 ENCOUNTER — Other Ambulatory Visit: Payer: Self-pay

## 2019-03-03 ENCOUNTER — Ambulatory Visit (LOCAL_COMMUNITY_HEALTH_CENTER): Payer: Self-pay

## 2019-03-03 VITALS — BP 114/77 | Ht 65.0 in | Wt 123.2 lb

## 2019-03-03 DIAGNOSIS — A64 Unspecified sexually transmitted disease: Secondary | ICD-10-CM

## 2019-03-03 DIAGNOSIS — Z30013 Encounter for initial prescription of injectable contraceptive: Secondary | ICD-10-CM

## 2019-03-03 DIAGNOSIS — Z3042 Encounter for surveillance of injectable contraceptive: Secondary | ICD-10-CM

## 2019-03-03 DIAGNOSIS — Z3009 Encounter for other general counseling and advice on contraception: Secondary | ICD-10-CM

## 2019-03-03 MED ORDER — MEDROXYPROGESTERONE ACETATE 150 MG/ML IM SUSP
150.0000 mg | Freq: Once | INTRAMUSCULAR | Status: AC
  Administered 2019-03-03: 10:00:00 150 mg via INTRAMUSCULAR

## 2019-03-03 NOTE — Progress Notes (Signed)
Provider orders completed. 

## 2019-03-03 NOTE — Progress Notes (Signed)
   [place] problem visit  [place] Department  Subjective:  Olivia Hanson is a 28 y.o. being seen today for birth control snd STD screen  Chief Complaint  Patient presents with  . Contraception    desires depo shot (in arm)  . Gynecologic Exam    PAP smear (none since 03/25/2014-normal), and STD checks   HPI   Client here today to restart Depo, STD screen and pap smear.  Her last pap was in 2015 and last Depo was 3-5 yrs ago.  LMP 02/21/2019.  Client denies unprotected sex since las menses and states she uses condoms for her method.  Client declines STD sympts.  She is not taking any medications and has not concerns.  Does the patient have a current or past history of drug use? No   No components found for: HCV]   Health Maintenance Due  Topic Date Due  . HIV Screening  08/12/2004  . TETANUS/TDAP  08/12/2008  . PAP-Cervical Cytology Screening  08/12/2010  . PAP SMEAR-Modifier  08/12/2010    ROS  The following portions of the patient's history were reviewed and updated as appropriate: allergies, current medications, past family history, past medical history, past social history, past surgical history and problem list. Problem list updated.   See flowsheet for other program required questions.  Objective:   Vitals:   03/03/19 0948  BP: 114/77  Weight: 123 lb 3.2 oz (55.9 kg)  Height: 5\' 5"  (1.651 m)    Physical Exam not indicated    Assessment and Plan:  Olivia Hanson is a 28 y.o. female presenting to the [facility] Department for a Women's Health problem visit  There are no diagnoses linked to this encounter Depo Provera 150 mg IM x 11-13 wks. X 1  Co. Client to make an appt. 3 mos for depo and completion of RV-needs pap.  STD screening Declines bloodwork.  GC/CT done today   No follow-ups on file.  No future appointments.  Hassell Done, FNP

## 2019-03-10 ENCOUNTER — Telehealth: Payer: Self-pay

## 2019-03-16 NOTE — Telephone Encounter (Signed)
TC with patient. Confirmed ID via SS#. Informed +Chlamydia and need for tx. Patient schedule for 03/17/19 and instructed to eat before appt. Aileen Fass, RN

## 2019-03-17 ENCOUNTER — Telehealth: Payer: Self-pay

## 2019-03-17 NOTE — Telephone Encounter (Signed)
Pt DNKA for Tx on 03/17/2019.

## 2019-03-30 ENCOUNTER — Telehealth: Payer: Self-pay

## 2019-03-30 NOTE — Telephone Encounter (Signed)
Attempted TC to patient re: [facility] Tx appt on 03/17/19. [facility] RN Aileen Fass, RN   Lab report placed in nurse clinic-Needs Chlamydia tx.

## 2019-11-21 ENCOUNTER — Ambulatory Visit: Payer: Self-pay | Attending: Internal Medicine

## 2019-11-21 ENCOUNTER — Ambulatory Visit: Payer: Self-pay

## 2020-10-10 ENCOUNTER — Ambulatory Visit (LOCAL_COMMUNITY_HEALTH_CENTER): Payer: Medicaid Other

## 2020-10-10 ENCOUNTER — Ambulatory Visit: Payer: Self-pay

## 2020-10-10 ENCOUNTER — Other Ambulatory Visit: Payer: Self-pay

## 2020-10-10 VITALS — BP 116/71 | Ht 65.0 in | Wt 121.5 lb

## 2020-10-10 DIAGNOSIS — Z3201 Encounter for pregnancy test, result positive: Secondary | ICD-10-CM

## 2020-10-10 DIAGNOSIS — A749 Chlamydial infection, unspecified: Secondary | ICD-10-CM

## 2020-10-10 LAB — PREGNANCY, URINE: Preg Test, Ur: POSITIVE — AB

## 2020-10-10 MED ORDER — AZITHROMYCIN 500 MG PO TABS
1000.0000 mg | ORAL_TABLET | Freq: Once | ORAL | Status: AC
  Administered 2020-10-10: 1000 mg via ORAL

## 2020-10-10 MED ORDER — PRENATAL VITAMIN 27-0.8 MG PO TABS: 1.0000 | ORAL_TABLET | Freq: Every day | ORAL | 0 refills | Status: AC

## 2020-10-10 NOTE — Progress Notes (Addendum)
Pt already has appt with [facility] scheduled and has communicated her hx to them. Pt wants to speak to [facility] and Medicaid today; sent to preadmit. No [facility].

## 2020-10-10 NOTE — Progress Notes (Signed)
Pt states she was contacted by [facility] about needing treatment for chlamydia, she was never treated for chlamydia and is now concerned about it due to being pregnant. Pt tested positive at [facility] 03/03/2019. Confirmed with Sadie Haber, PA that pt needs treament.

## 2022-09-19 ENCOUNTER — Ambulatory Visit: Payer: Self-pay

## 2023-06-07 ENCOUNTER — Other Ambulatory Visit: Payer: Medicaid Other

## 2024-05-25 ENCOUNTER — Ambulatory Visit (LOCAL_COMMUNITY_HEALTH_CENTER)

## 2024-05-25 VITALS — BP 128/79 | Ht 65.0 in | Wt 129.0 lb

## 2024-05-25 DIAGNOSIS — Z309 Encounter for contraceptive management, unspecified: Secondary | ICD-10-CM | POA: Diagnosis not present

## 2024-05-25 DIAGNOSIS — Z3201 Encounter for pregnancy test, result positive: Secondary | ICD-10-CM | POA: Diagnosis not present

## 2024-05-25 MED ORDER — PRENATAL 27-0.8 MG PO TABS: 1.0000 | ORAL_TABLET | Freq: Every day | ORAL | Status: AC

## 2024-05-25 NOTE — Progress Notes (Signed)
 UPT positive. Planning on prenatal care not in house. Thinking about [facility] or [facility]. Sent to clerical for eligibility.  The patient was dispensed PNV #[id number] today. I provided counseling today regarding the medication. We discussed the medication, the side effects and when to call clinic. Patient given the opportunity to ask questions. Questions answered.

## 2024-05-26 LAB — PREGNANCY, URINE: Preg Test, Ur: POSITIVE — AB

## 2024-07-30 ENCOUNTER — Ambulatory Visit
Admission: EM | Admit: 2024-07-30 | Discharge: 2024-07-30 | Disposition: A | Discharge status: Home / Self Care | Source: Home / Self Care

## 2024-07-30 DIAGNOSIS — J101 Influenza due to other identified influenza virus with other respiratory manifestations: Secondary | ICD-10-CM

## 2024-07-30 DIAGNOSIS — Z3A17 17 weeks gestation of pregnancy: Secondary | ICD-10-CM | POA: Diagnosis not present

## 2024-07-30 LAB — POC COVID19/FLU A&B COMBO
Covid Antigen, POC: NEGATIVE
Influenza A Antigen, POC: POSITIVE — AB
Influenza B Antigen, POC: NEGATIVE

## 2024-07-30 NOTE — ED Triage Notes (Signed)
 Patient to Urgent Care with complaints of cough/ fever/ chest congestion.  Symptoms started last night.  Taking mucinex.

## 2024-07-30 NOTE — ED Provider Notes (Signed)
 Olivia Hanson    CSN: [id number] Arrival date & time: 07/30/24  1010      History   Chief Complaint Chief Complaint  Patient presents with   Cough    HPI Olivia Hanson is a 33 y.o. female.  Patient is [redacted] weeks pregnant.  Accompanied by her son, she presents with 1 day history of subjective fever, congestion, cough.  No shortness of breath, vomiting, diarrhea, abdominal pain.  She has been treating her symptoms with Mucinex.  The history is provided by the patient and medical records.    Past Medical History:  Diagnosis Date   Anxiety    Asthma    Depression    Preeclampsia    Syncope     There are no active problems to display for this patient.   Past Surgical History:  Procedure Laterality Date   CESAREAN SECTION     ECTOPIC PREGNANCY SURGERY      OB History     Gravida  6   Para  1   Term  1   Preterm  0   AB  3   Living  1      SAB  2   IAB  0   Ectopic  1   Multiple  0   Live Births  1            Home Medications    Prior to Admission medications  Medication Sig Start Date End Date Taking? Authorizing Provider  albuterol  (PROVENTIL  HFA;VENTOLIN  HFA) 108 (90 Base) MCG/ACT inhaler Inhale 2 puffs into the lungs every 6 (six) hours as needed for wheezing or shortness of breath. Patient not taking: Reported on 10/10/2020    [provider]  clonazePAM (KLONOPIN) 1 MG tablet Take 1 mg by mouth daily as needed. For anxiety. Patient not taking: Reported on 10/10/2020    [provider]  cyclobenzaprine  (FLEXERIL ) 10 MG tablet Take 1 tablet (10 mg total) by mouth 3 (three) times daily as needed for muscle spasms. Patient not taking: No sig reported 08/10/15   Cuthriell, Dorn BIRCH, PA-C  HYDROcodone -acetaminophen  (NORCO/VICODIN) 5-325 MG per tablet Take 2 tablets by mouth every 4 (four) hours as needed. Patient not taking: No sig reported 07/14/13   Cammie Raisin K, PA-C  ibuprofen  (ADVIL ,MOTRIN ) 800 MG  tablet Take 1 tablet (800 mg total) by mouth every 8 (eight) hours as needed. Patient not taking: No sig reported 04/06/15   Trudy Dorn BRAVO, MD  meloxicam  (MOBIC ) 15 MG tablet Take 1 tablet (15 mg total) by mouth daily. Patient not taking: No sig reported 08/10/15   Cuthriell, Dorn BIRCH, PA-C  ondansetron  (ZOFRAN  ODT) 4 MG disintegrating tablet Take 1 tablet (4 mg total) by mouth every 8 (eight) hours as needed for nausea. Patient not taking: No sig reported 07/14/13   Palmer, Jessica K, PA-C  predniSONE  (DELTASONE ) 20 MG tablet Take 2 tablets the first day, then take one tablet a day for 4 more days. Patient not taking: No sig reported 07/14/15   Audie Alm ORN, MD  Prenatal Vit-Fe Fumarate-FA (MULTIVITAMIN-PRENATAL) 27-0.8 MG TABS tablet Take 1 tablet by mouth daily at 12 noon. 05/25/24 09/02/24  Macario Dorothyann HERO, MD  Spacer/Aero-Holding Chambers [facility] ADVANTAGE) MISC 1 each by Other route once. Always uses her when you're using a metered-dose inhaler. You've aromatase medicine as much, he won't have his much side effect, but you it twice as much medicine and your lungs. Patient not taking: No sig reported  07/14/15   Audie Alm ORN, MD    Family History Family History  Problem Relation Age of Onset   Bipolar disorder Mother    Schizophrenia Mother    Diabetes Maternal Grandmother    Colon cancer Paternal Grandmother     Social History Social History[1]   Allergies   Patient has no known allergies.   Review of Systems Review of Systems  Constitutional:  Positive for fever. Negative for chills.  HENT:  Positive for congestion. Negative for ear pain and sore throat.   Respiratory:  Positive for cough. Negative for shortness of breath.   Gastrointestinal:  Negative for anal bleeding, diarrhea and vomiting.     Physical Exam Triage Vital Signs ED Triage Vitals  Encounter Vitals Group     BP 07/30/24 1105 109/76     Girls Systolic BP Percentile --      Girls  Diastolic BP Percentile --      Boys Systolic BP Percentile --      Boys Diastolic BP Percentile --      Pulse Rate 07/30/24 1105 (!) 109     Resp 07/30/24 1105 18     Temp 07/30/24 1105 98.2 F (36.8 C)     Temp src --      SpO2 07/30/24 1105 96 %     Weight --      Height --      Head Circumference --      Peak Flow --      Pain Score 07/30/24 1112 4     Pain Loc --      Pain Education --      Exclude from Growth Chart --    No data found.  Updated Vital Signs BP 109/76   Pulse (!) 109   Temp 98.2 F (36.8 C)   Resp 18   LMP 03/21/2024   SpO2 96%   Visual Acuity Right Eye Distance:   Left Eye Distance:   Bilateral Distance:    Right Eye Near:   Left Eye Near:    Bilateral Near:     Physical Exam Constitutional:      General: She is not in acute distress. HENT:     Right Ear: Tympanic membrane normal.     Left Ear: Tympanic membrane normal.     Nose: Nose normal.     Mouth/Throat:     Mouth: Mucous membranes are moist.     Pharynx: Oropharynx is clear.  Cardiovascular:     Rate and Rhythm: Normal rate and regular rhythm.     Heart sounds: Normal heart sounds.  Pulmonary:     Effort: Pulmonary effort is normal. No respiratory distress.     Breath sounds: Normal breath sounds.  Neurological:     Mental Status: She is alert.      UC Treatments / Results  Labs (all labs ordered are listed, but only abnormal results are displayed) Labs Reviewed  POC COVID19/FLU A&B COMBO - Abnormal; Notable for the following components:      Result Value   Influenza A Antigen, POC Positive (*)    All other components within normal limits    EKG   Radiology No results found.  Procedures Procedures (including critical care time)  Medications Ordered in UC Medications - No data to display  Initial Impression / Assessment and Plan / UC Course  I have reviewed the triage vital signs and the nursing notes.  Pertinent labs & imaging results that were available  during my care of the patient were reviewed by me and considered in my medical decision making (see chart for details).    Influenza A, [redacted] weeks pregnant.  Lungs are clear and O2 sat is 96% on room air.  Rapid flu test positive for influenza A.  COVID-negative.  Patient declines Tamiflu.  Discussed symptomatic treatment including Tylenol  as needed, rest, hydration.  Education provided on influenza.  Instructed patient to notify her OB/GYN that she has the flu.  ED precautions given.  Patient agrees to plan of care.   Final Clinical Impressions(s) / UC Diagnoses   Final diagnoses:  Influenza A  [redacted] weeks gestation of pregnancy     Discharge Instructions      Please notify your OB/GYN that you are positive for the flu.  Take Tylenol  as needed for fever or discomfort.  Stay hydrated with clear liquids such as water.  Follow-up with your OB/GYN.  Go to the emergency department if you have worsening symptoms.     ED Prescriptions   None    PDMP not reviewed this encounter.    [1]  Social History Tobacco Use   Smoking status: Former    Types: Cigarettes   Smokeless tobacco: Never  Vaping Use   Vaping status: Never Used  Substance Use Topics   Alcohol use: Not Currently   Drug use: Not Currently    Types: Marijuana     Corlis Burnard DEL, NP 07/30/24 1147

## 2024-07-30 NOTE — Discharge Instructions (Addendum)
 Please notify your OB/GYN that you are positive for the flu.  Take Tylenol  as needed for fever or discomfort.  Stay hydrated with clear liquids such as water.  Follow-up with your OB/GYN.  Go to the emergency department if you have worsening symptoms.
